# Patient Record
Sex: Male | Born: 1955 | Race: White | Hispanic: No | Marital: Single
Health system: Southern US, Community
[De-identification: ages and names within clinical notes are randomized; demographics above are authoritative.]

## PROBLEM LIST (undated history)

## (undated) DIAGNOSIS — F17200 Nicotine dependence, unspecified, uncomplicated: Secondary | ICD-10-CM

## (undated) DIAGNOSIS — R6521 Severe sepsis with septic shock: Secondary | ICD-10-CM

## (undated) DIAGNOSIS — I482 Chronic atrial fibrillation, unspecified: Secondary | ICD-10-CM

## (undated) DIAGNOSIS — G9341 Metabolic encephalopathy: Secondary | ICD-10-CM

## (undated) DIAGNOSIS — I639 Cerebral infarction, unspecified: Secondary | ICD-10-CM

## (undated) DIAGNOSIS — J690 Pneumonitis due to inhalation of food and vomit: Secondary | ICD-10-CM

## (undated) DIAGNOSIS — J9621 Acute and chronic respiratory failure with hypoxia: Secondary | ICD-10-CM

## (undated) DIAGNOSIS — J449 Chronic obstructive pulmonary disease, unspecified: Secondary | ICD-10-CM

## (undated) HISTORY — PX: TRACHEOSTOMY: SUR1362

---

## 2017-11-15 ENCOUNTER — Inpatient Hospital Stay
Admission: AD | Admit: 2017-11-15 | Discharge: 2017-12-30 | Disposition: A | Discharge status: Skilled Nursing Facility | Payer: Medicaid - Out of State | Source: Other Acute Inpatient Hospital | Attending: Internal Medicine | Admitting: Internal Medicine

## 2017-11-15 ENCOUNTER — Other Ambulatory Visit (HOSPITAL_COMMUNITY): Payer: Medicaid - Out of State

## 2017-11-15 DIAGNOSIS — I482 Chronic atrial fibrillation, unspecified: Secondary | ICD-10-CM | POA: Diagnosis present

## 2017-11-15 DIAGNOSIS — J9690 Respiratory failure, unspecified, unspecified whether with hypoxia or hypercapnia: Secondary | ICD-10-CM

## 2017-11-15 DIAGNOSIS — T85598A Other mechanical complication of other gastrointestinal prosthetic devices, implants and grafts, initial encounter: Secondary | ICD-10-CM

## 2017-11-15 DIAGNOSIS — J449 Chronic obstructive pulmonary disease, unspecified: Secondary | ICD-10-CM | POA: Diagnosis present

## 2017-11-15 DIAGNOSIS — J9621 Acute and chronic respiratory failure with hypoxia: Secondary | ICD-10-CM | POA: Diagnosis present

## 2017-11-15 DIAGNOSIS — Z431 Encounter for attention to gastrostomy: Secondary | ICD-10-CM

## 2017-11-15 DIAGNOSIS — G9341 Metabolic encephalopathy: Secondary | ICD-10-CM | POA: Diagnosis present

## 2017-11-15 DIAGNOSIS — J690 Pneumonitis due to inhalation of food and vomit: Secondary | ICD-10-CM | POA: Diagnosis present

## 2017-11-15 DIAGNOSIS — Z931 Gastrostomy status: Secondary | ICD-10-CM

## 2017-11-15 DIAGNOSIS — R6521 Severe sepsis with septic shock: Secondary | ICD-10-CM | POA: Diagnosis present

## 2017-11-15 DIAGNOSIS — J189 Pneumonia, unspecified organism: Secondary | ICD-10-CM

## 2017-11-15 DIAGNOSIS — Z0189 Encounter for other specified special examinations: Secondary | ICD-10-CM

## 2017-11-15 DIAGNOSIS — F05 Delirium due to known physiological condition: Secondary | ICD-10-CM

## 2017-11-15 HISTORY — DX: Nicotine dependence, unspecified, uncomplicated: F17.200

## 2017-11-15 HISTORY — DX: Cerebral infarction, unspecified: I63.9

## 2017-11-15 HISTORY — DX: Metabolic encephalopathy: G93.41

## 2017-11-15 HISTORY — DX: Severe sepsis with septic shock: R65.21

## 2017-11-15 HISTORY — DX: Acute and chronic respiratory failure with hypoxia: J96.21

## 2017-11-15 HISTORY — DX: Chronic atrial fibrillation, unspecified: I48.20

## 2017-11-15 HISTORY — DX: Pneumonitis due to inhalation of food and vomit: J69.0

## 2017-11-15 HISTORY — DX: Chronic obstructive pulmonary disease, unspecified: J44.9

## 2017-11-15 LAB — BLOOD GAS, ARTERIAL
Acid-Base Excess: 7.8 mmol/L — ABNORMAL HIGH (ref 0.0–2.0)
Bicarbonate: 31.9 mmol/L — ABNORMAL HIGH (ref 20.0–28.0)
FIO2: 0.28
LHR: 15 {breaths}/min
MECHVT: 450 mL
O2 SAT: 98.1 %
PATIENT TEMPERATURE: 98.6
PEEP/CPAP: 5 cmH2O
PO2 ART: 107 mmHg (ref 83.0–108.0)
pCO2 arterial: 45.6 mmHg (ref 32.0–48.0)
pH, Arterial: 7.459 — ABNORMAL HIGH (ref 7.350–7.450)

## 2017-11-15 LAB — C DIFFICILE QUICK SCREEN W PCR REFLEX
C DIFFICILE (CDIFF) TOXIN: NEGATIVE
C DIFFICLE (CDIFF) ANTIGEN: NEGATIVE
C Diff interpretation: NOT DETECTED

## 2017-11-15 MED ORDER — GENERIC EXTERNAL MEDICATION
20.00 | Status: DC
Start: ? — End: 2017-11-15

## 2017-11-15 MED ORDER — GENERIC EXTERNAL MEDICATION
250.00 | Status: DC
Start: 2017-11-16 — End: 2017-11-15

## 2017-11-15 MED ORDER — FENTANYL CITRATE-NACL 2.5-0.9 MG/250ML-% IV SOLN
12.50 | INTRAVENOUS | Status: DC
Start: ? — End: 2017-11-15

## 2017-11-15 MED ORDER — QUETIAPINE FUMARATE 25 MG PO TABS
25.00 | ORAL_TABLET | ORAL | Status: DC
Start: 2017-11-16 — End: 2017-11-15

## 2017-11-15 MED ORDER — OXYCODONE HCL 5 MG PO TABS
5.00 | ORAL_TABLET | ORAL | Status: DC
Start: 2017-11-15 — End: 2017-11-15

## 2017-11-15 MED ORDER — FENTANYL CITRATE (PF) 2500 MCG/50ML IJ SOLN
12.50 | INTRAMUSCULAR | Status: DC
Start: ? — End: 2017-11-15

## 2017-11-15 MED ORDER — CHLORHEXIDINE GLUCONATE 0.12 % MT SOLN
15.00 | OROMUCOSAL | Status: DC
Start: 2017-11-15 — End: 2017-11-15

## 2017-11-15 MED ORDER — METOPROLOL TARTRATE 25 MG PO TABS
12.50 | ORAL_TABLET | ORAL | Status: DC
Start: 2017-11-15 — End: 2017-11-15

## 2017-11-15 MED ORDER — AMIODARONE HCL 200 MG PO TABS
200.00 | ORAL_TABLET | ORAL | Status: DC
Start: 2017-11-16 — End: 2017-11-15

## 2017-11-15 MED ORDER — IPRATROPIUM-ALBUTEROL 0.5-2.5 (3) MG/3ML IN SOLN
3.00 | RESPIRATORY_TRACT | Status: DC
Start: 2017-11-15 — End: 2017-11-15

## 2017-11-15 MED ORDER — METOCLOPRAMIDE HCL 5 MG/ML IJ SOLN
10.00 | INTRAMUSCULAR | Status: DC
Start: 2017-11-15 — End: 2017-11-15

## 2017-11-15 MED ORDER — METOPROLOL TARTRATE 5 MG/5ML IV SOLN
5.00 | INTRAVENOUS | Status: DC
Start: ? — End: 2017-11-15

## 2017-11-15 MED ORDER — POLYETHYLENE GLYCOL 3350 17 G PO PACK
17.00 | PACK | ORAL | Status: DC
Start: ? — End: 2017-11-15

## 2017-11-15 MED ORDER — MIDAZOLAM HCL 5 MG/ML IJ SOLN
2.00 | INTRAMUSCULAR | Status: DC
Start: ? — End: 2017-11-15

## 2017-11-15 MED ORDER — MIDODRINE HCL 5 MG PO TABS
5.00 | ORAL_TABLET | ORAL | Status: DC
Start: 2017-11-15 — End: 2017-11-15

## 2017-11-15 MED ORDER — DOCUSATE SODIUM 150 MG/15ML PO LIQD
100.00 | ORAL | Status: DC
Start: 2017-11-16 — End: 2017-11-15

## 2017-11-15 MED ORDER — BISACODYL 10 MG RE SUPP
10.00 | RECTAL | Status: DC
Start: 2017-11-15 — End: 2017-11-15

## 2017-11-15 MED ORDER — ENOXAPARIN SODIUM 40 MG/0.4ML ~~LOC~~ SOLN
40.00 | SUBCUTANEOUS | Status: DC
Start: 2017-11-15 — End: 2017-11-15

## 2017-11-15 MED ORDER — CLONAZEPAM 0.5 MG PO TABS
2.00 | ORAL_TABLET | ORAL | Status: DC
Start: 2017-11-15 — End: 2017-11-15

## 2017-11-15 MED ORDER — ONDANSETRON HCL 4 MG/2ML IJ SOLN
4.00 | INTRAMUSCULAR | Status: DC
Start: ? — End: 2017-11-15

## 2017-11-15 MED ORDER — IOPAMIDOL (ISOVUE-300) INJECTION 61%
INTRAVENOUS | Status: AC
  Filled 2017-11-15: qty 50

## 2017-11-15 MED ORDER — DEXMEDETOMIDINE HCL IN NACL 400 MCG/100ML IV SOLN
0.10 | INTRAVENOUS | Status: DC
Start: ? — End: 2017-11-15

## 2017-11-15 MED ORDER — PHENOBARBITAL 32.4 MG PO TABS
97.20 | ORAL_TABLET | ORAL | Status: DC
Start: 2017-11-15 — End: 2017-11-15

## 2017-11-15 MED ORDER — MIRTAZAPINE 15 MG PO TABS
15.00 | ORAL_TABLET | ORAL | Status: DC
Start: 2017-11-15 — End: 2017-11-15

## 2017-11-15 MED ORDER — SODIUM CHLORIDE 0.9 % IV SOLN
10.00 | INTRAVENOUS | Status: DC
Start: ? — End: 2017-11-15

## 2017-11-15 MED ORDER — ACETAMINOPHEN 325 MG PO TABS
650.00 | ORAL_TABLET | ORAL | Status: DC
Start: ? — End: 2017-11-15

## 2017-11-15 MED ORDER — QUETIAPINE FUMARATE 25 MG PO TABS
50.00 | ORAL_TABLET | ORAL | Status: DC
Start: 2017-11-15 — End: 2017-11-15

## 2017-11-16 ENCOUNTER — Other Ambulatory Visit (HOSPITAL_COMMUNITY): Payer: Medicaid - Out of State

## 2017-11-16 ENCOUNTER — Institutional Professional Consult (permissible substitution) (HOSPITAL_COMMUNITY): Payer: Medicaid - Out of State

## 2017-11-16 DIAGNOSIS — I482 Chronic atrial fibrillation: Secondary | ICD-10-CM

## 2017-11-16 DIAGNOSIS — J690 Pneumonitis due to inhalation of food and vomit: Secondary | ICD-10-CM | POA: Diagnosis not present

## 2017-11-16 DIAGNOSIS — R6521 Severe sepsis with septic shock: Secondary | ICD-10-CM

## 2017-11-16 DIAGNOSIS — G9341 Metabolic encephalopathy: Secondary | ICD-10-CM

## 2017-11-16 DIAGNOSIS — J449 Chronic obstructive pulmonary disease, unspecified: Secondary | ICD-10-CM

## 2017-11-16 DIAGNOSIS — J9621 Acute and chronic respiratory failure with hypoxia: Secondary | ICD-10-CM

## 2017-11-16 LAB — COMPREHENSIVE METABOLIC PANEL
ALK PHOS: 97 U/L (ref 38–126)
ALT: 31 U/L (ref 0–44)
ANION GAP: 10 (ref 5–15)
AST: 18 U/L (ref 15–41)
Albumin: 2.6 g/dL — ABNORMAL LOW (ref 3.5–5.0)
BILIRUBIN TOTAL: 0.8 mg/dL (ref 0.3–1.2)
BUN: 14 mg/dL (ref 8–23)
CHLORIDE: 99 mmol/L (ref 98–111)
CO2: 30 mmol/L (ref 22–32)
Calcium: 8.8 mg/dL — ABNORMAL LOW (ref 8.9–10.3)
Creatinine, Ser: 0.4 mg/dL — ABNORMAL LOW (ref 0.61–1.24)
GFR calc non Af Amer: >60 mL/min (ref >60)
Glucose, Bld: 111 mg/dL — ABNORMAL HIGH (ref 70–99)
POTASSIUM: 3.4 mmol/L — AB (ref 3.5–5.1)
SODIUM: 139 mmol/L (ref 135–145)
Total Protein: 5.4 g/dL — ABNORMAL LOW (ref 6.5–8.1)

## 2017-11-16 LAB — CBC
HEMATOCRIT: 25.8 % — AB (ref 39.0–52.0)
HEMOGLOBIN: 8.2 g/dL — AB (ref 13.0–17.0)
MCH: 32.9 pg (ref 26.0–34.0)
MCHC: 31.8 g/dL (ref 30.0–36.0)
MCV: 103.6 fL — AB (ref 78.0–100.0)
Platelets: 203 10*3/uL (ref 150–400)
RBC: 2.49 MIL/uL — AB (ref 4.22–5.81)
RDW: 15.6 % — ABNORMAL HIGH (ref 11.5–15.5)
WBC: 3.1 10*3/uL — ABNORMAL LOW (ref 4.0–10.5)

## 2017-11-16 LAB — PROTIME-INR
INR: 1.24
Prothrombin Time: 15.5 seconds — ABNORMAL HIGH (ref 11.4–15.2)

## 2017-11-16 LAB — TSH: TSH: 3.733 u[IU]/mL (ref 0.350–4.500)

## 2017-11-16 NOTE — Consult Note (Signed)
Pulmonary Critical Care Medicine [facility] [place]  PULMONARY SERVICE  Date of Service: 11/16/2017  PULMONARY CRITICAL CARE Daniel Irwin  ZOX:[id number]  DOB: 1955-04-17   DOA: 11/15/2017  Referring Physician: Carron Curie, MD  HPI: Daniel Irwin is a 62 y.o. male seen for follow up of Acute on Chronic Respiratory Failure.  Patient presents for further management of respiratory failure and weaning.  Basically presented to the acute care facility on August 13 with a diagnosis of stroke.  The patient apparently had been in the hospital 1 week prior with an acute exacerbation of COPD and a left basilar pneumonia.  The patient left AGAINST MEDICAL ADVICE.  The patient had been apparently intubated at that previous admission and had been extubated 3 days after admission.  [facility] however patient had a sudden change in his mental status and agitation and delirium and his pupils were found at that time to be unequal.  He was intubated and sent for a CT scan which did not show an acute stroke.  Post intubation x-ray had shown worsening of his lung infiltrate with a complete white out and mucous plugging.  Patient was transferred to a tertiary care center and he was noted to have mucous plugging.  Since the patient was not able to wean patient underwent a tracheostomy by ENT.  The patient has continued unfortunately to have delirium confusion and they were not able to wean him off of the propofol and was actually placed on Precedex.  He was transferred then to our facility for further management.  Review of Systems:  ROS performed and is unremarkable other than noted above.  Past Medical History:  Diagnosis Date  . Acute on chronic respiratory failure with hypoxia (HCC)   . Aspiration pneumonia due to gastric secretions (HCC)   . Chronic atrial fibrillation (HCC)   . COPD, severe (HCC)   . Metabolic encephalopathy   . Severe sepsis with septic  shock (CODE) (HCC)   . Smoker   . Stroke (cerebrum) [facility])     Past Surgical History:  Procedure Laterality Date  . TRACHEOSTOMY      Social History:    has an unknown smoking status. He has never used smokeless tobacco. He reports that he drank alcohol. He reports that he has current or past drug history.  Family History: Non-Contributory to the present illness  Allergies not on file  Medications: Reviewed on Rounds  Physical Exam:  Vitals: Temperature 98.6 pulse 90 respiratory rate 18 blood pressure is 130/60 saturations 99%  Ventilator Settings mode of ventilation pressure support FiO2 28% tidal volume 653 pressure support 12 PEEP 5  . General: Comfortable at this time . Eyes: Grossly normal lids, irises & conjunctiva . ENT: grossly tongue is normal . Neck: no obvious mass . Cardiovascular: S1-S2 normal no gallop or rub . Respiratory: Few scattered distant rhonchi . Abdomen: Soft and nontender . Skin: no rash seen on limited exam . Musculoskeletal: not rigid . Psychiatric:unable to assess . Neurologic: no seizure no involuntary movements         Labs on Admission:  Basic Metabolic Panel: Recent Labs  Lab 11/16/17 0638  NA 139  K 3.4*  CL 99  CO2 30  GLUCOSE 111*  BUN 14  CREATININE 0.40*  CALCIUM 8.8*    Recent Labs  Lab 11/15/17 1534  PHART 7.459*  PCO2ART 45.6  PO2ART 107  HCO3 31.9*  O2SAT 98.1  Liver Function Tests: Recent Labs  Lab 11/16/17 0638  AST 18  ALT 31  ALKPHOS 97  BILITOT 0.8  PROT 5.4*  ALBUMIN 2.6*   No results for input(s): LIPASE, AMYLASE in the last 168 hours. No results for input(s): AMMONIA in the last 168 hours.  CBC: Recent Labs  Lab 11/16/17 0638  WBC 3.1*  HGB 8.2*  HCT 25.8*  MCV 103.6*  PLT 203    Cardiac Enzymes: No results for input(s): CKTOTAL, CKMB, CKMBINDEX, TROPONINI in the last 168 hours.  BNP (last 3 results) No results for input(s): BNP in the last 8760 hours.  ProBNP (last 3  results) No results for input(s): PROBNP in the last 8760 hours.   Radiological Exams on Admission: Dg Chest Port 1 View  Result Date: 11/15/2017 CLINICAL DATA:  62 year old male with respiratory failure, currently ventilated EXAM: PORTABLE CHEST 1 VIEW COMPARISON:  None. FINDINGS: Tracheostomy tube present. The tip is midline and at the level of the clavicles. A right IJ central venous catheter is present. The catheter tip overlies the cavoatrial junction. A nasogastric tube is present, the tip overlies the gastric fundus. Cardiac and mediastinal contours are within normal limits. Atherosclerotic calcifications present in the transverse aorta. Bilateral interstitial prominence and diffuse bronchitic changes. The lungs are also hyperinflated. No pneumothorax, pulmonary edema or large effusion. Nonspecific retrocardiac opacity may reflect atelectasis or infiltrate. IMPRESSION: 1. Satisfactory position of support apparatus. 2. Hyperinflation, diffuse bronchitic and interstitial changes favored to represent sequelae of COPD. 3. Nonspecific left retrocardiac opacity may reflect atelectasis, scarring or infiltrate. 4.  Aortic Atherosclerosis (ICD10-170.0) Electronically Signed   By: Malachy Moan M.D.   On: 11/15/2017 14:11   Dg Abd Portable 1v  Result Date: 11/15/2017 CLINICAL DATA:  NG tube placement EXAM: PORTABLE ABDOMEN - 1 VIEW COMPARISON:  None. FINDINGS: Monitor leads overlie the chest and abdomen. NG tube extends to the proximal stomach beneath the left hemidiaphragm. Central line tip at the [facility] RA junction, partially imaged. Nonspecific bowel gas pattern. IMPRESSION: NG tube proximal stomach. Electronically Signed   By: Judie Petit.  Shick M.D.   On: 11/15/2017 14:08    Assessment/Plan Active Problems:   Acute on chronic respiratory failure with hypoxia (HCC)   Severe sepsis with septic shock (CODE) (HCC)   Chronic atrial fibrillation (HCC)   Metabolic encephalopathy   COPD, severe (HCC)    Aspiration pneumonia due to gastric secretions (HCC)   1. Acute on chronic respiratory failure with hypoxia patient is going to continue with weaning on pressure support.  The patient's goal is for about 4 hours.  So far looks good continue to advance the wean as tolerated continue pulmonary toilet secretion management. 2. Severe sepsis with shock right now is hemodynamically stable we are going to continue with supportive care monitor pressures. 3. Chronic atrial fibrillation rate is controlled continue to monitor rhythm. 4. Metabolic encephalopathy grossly unchanged tolerating some confusion. 5. Severe COPD continue with supportive care. 6. Pneumonia due to aspiration treated with antibiotics.  The last chest film shows chronic bronchitic changes interstitial changes COPD.  Need to also monitor for any mucus plugging's previous history of aspiration he is at increased risk  I have personally seen and evaluated the patient, evaluated laboratory and imaging results, formulated the assessment and plan and placed orders. The Patient requires high complexity decision making for assessment and support.  Case was discussed on Rounds with the Respiratory Therapy Staff Time Spent review of the medical chart and previous hospitalization.  Also discussion with the medical staff on rounds  Yevonne Pax, MD [facility] Pulmonary Critical Care Medicine Sleep Medicine

## 2017-11-17 ENCOUNTER — Encounter: Payer: Self-pay | Admitting: Internal Medicine

## 2017-11-17 DIAGNOSIS — J9621 Acute and chronic respiratory failure with hypoxia: Secondary | ICD-10-CM | POA: Diagnosis present

## 2017-11-17 DIAGNOSIS — J690 Pneumonitis due to inhalation of food and vomit: Secondary | ICD-10-CM | POA: Diagnosis present

## 2017-11-17 DIAGNOSIS — I482 Chronic atrial fibrillation, unspecified: Secondary | ICD-10-CM | POA: Diagnosis present

## 2017-11-17 DIAGNOSIS — J449 Chronic obstructive pulmonary disease, unspecified: Secondary | ICD-10-CM | POA: Diagnosis present

## 2017-11-17 DIAGNOSIS — G9341 Metabolic encephalopathy: Secondary | ICD-10-CM | POA: Diagnosis present

## 2017-11-17 DIAGNOSIS — R6521 Severe sepsis with septic shock: Secondary | ICD-10-CM | POA: Diagnosis present

## 2017-11-17 LAB — POTASSIUM: POTASSIUM: 3.3 mmol/L — AB (ref 3.5–5.1)

## 2017-11-17 NOTE — Progress Notes (Signed)
Pulmonary Critical Care Medicine [facility] [place]   PULMONARY CRITICAL CARE SERVICE  PROGRESS NOTE  Date of Service: 11/17/2017  Daniel Irwin  KCL:[id number]  DOB: 12-May-1955   DOA: 11/15/2017  Referring Physician: Carron Curie, MD  HPI: Daniel Irwin is a 62 y.o. male seen for follow up of Acute on Chronic Respiratory Failure.  Patient is comfortable without distress right now is weaning on pressure support the goal is for about 8 hours today.  Medications: Reviewed on Rounds  Physical Exam:  Vitals: Temperature 97.4 pulse 90 respiratory rate 31 blood pressure 115/56 saturation 95%  Ventilator Settings mode of ventilation pressure support FiO2 28% tidal volume 377 pressure support 12 PEEP 5  . General: Comfortable at this time . Eyes: Grossly normal lids, irises & conjunctiva . ENT: grossly tongue is normal . Neck: no obvious mass . Cardiovascular: S1 S2 normal no gallop . Respiratory: Few scattered distant rhonchi are noted . Abdomen: soft . Skin: no rash seen on limited exam . Musculoskeletal: not rigid . Psychiatric:unable to assess . Neurologic: no seizure no involuntary movements         Lab Data:   Basic Metabolic Panel: Recent Labs  Lab 11/16/17 0638 11/17/17 0525  NA 139  --   K 3.4* 3.3*  CL 99  --   CO2 30  --   GLUCOSE 111*  --   BUN 14  --   CREATININE 0.40*  --   CALCIUM 8.8*  --     ABG: Recent Labs  Lab 11/15/17 1534  PHART 7.459*  PCO2ART 45.6  PO2ART 107  HCO3 31.9*  O2SAT 98.1    Liver Function Tests: Recent Labs  Lab 11/16/17 0638  AST 18  ALT 31  ALKPHOS 97  BILITOT 0.8  PROT 5.4*  ALBUMIN 2.6*   No results for input(s): LIPASE, AMYLASE in the last 168 hours. No results for input(s): AMMONIA in the last 168 hours.  CBC: Recent Labs  Lab 11/16/17 0638  WBC 3.1*  HGB 8.2*  HCT 25.8*  MCV 103.6*  PLT 203    Cardiac Enzymes: No results for input(s): CKTOTAL, CKMB, CKMBINDEX, TROPONINI in the last 168  hours.  BNP (last 3 results) No results for input(s): BNP in the last 8760 hours.  ProBNP (last 3 results) No results for input(s): PROBNP in the last 8760 hours.  Radiological Exams: Dg Abd Portable 1v  Result Date: 11/16/2017 CLINICAL DATA:  NG tube placement EXAM: PORTABLE ABDOMEN - 1 VIEW COMPARISON:  11/16/2017 FINDINGS: Esophageal tube tip overlies the gastric fundus region. Elevation of the left diaphragm. Mild gaseous enlargement of left upper quadrant bowel. IMPRESSION: Esophageal tube tip overlies the proximal stomach Electronically Signed   By: Jasmine Pang M.D.   On: 11/16/2017 23:57   Dg Abd Portable 1v  Result Date: 11/16/2017 CLINICAL DATA:  62 year old male with history of nasogastric tube placement. EXAM: PORTABLE ABDOMEN - 1 VIEW COMPARISON:  Abdominal radiograph 11/16/2017. FINDINGS: Previously noted nasogastric tube is no longer confidently identified. Abdomen is incompletely imaged, but there is a nonobstructive bowel gas pattern. IMPRESSION: 1. Nasogastric tube not visualized, presumably withdrawn or potentially coiled in the oropharynx. Electronically Signed   By: Trudie Reed M.D.   On: 11/16/2017 19:54   Dg Abd Portable 1v  Result Date: 11/16/2017 CLINICAL DATA:  62 year old male. Nasogastric tube placement. Subsequent encounter. EXAM: PORTABLE ABDOMEN - 1 VIEW COMPARISON:  11/15/2017. FINDINGS: Nasogastric tube side hole just beyond the gastroesophageal junction level. Tip gastric  fundus-body junction level. Gas-filled bowel right aspect of the abdomen. IMPRESSION: Nasogastric tube tip gastric fundus-body junction level. Nasogastric tube side hole just beyond the gastroesophageal junction level. Electronically Signed   By: Lacy Duverney M.D.   On: 11/16/2017 15:01    Assessment/Plan Active Problems:   Acute on chronic respiratory failure with hypoxia (HCC)   Severe sepsis with septic shock (CODE) (HCC)   Chronic atrial fibrillation (HCC)   Metabolic  encephalopathy   COPD, severe (HCC)   Aspiration pneumonia due to gastric secretions (HCC)   1. Acute on chronic respiratory failure with hypoxia we will continue to wean on pressure support as mentioned the goal was for 8 hours continue to advance. 2. Severe sepsis with shock right now is hemodynamically stable 3. Chronic atrial fibrillation rate controlled 4. Metabolic encephalopathy grossly unchanged continue with present management. 5. Severe COPD at baseline 6. Aspiration pneumonia treated with antibiotics we will continue to follow   I have personally seen and evaluated the patient, evaluated laboratory and imaging results, formulated the assessment and plan and placed orders. The Patient requires high complexity decision making for assessment and support.  Case was discussed on Rounds with the Respiratory Therapy Staff  Yevonne Pax, MD [facility] Pulmonary Critical Care Medicine Sleep Medicine

## 2017-11-18 DIAGNOSIS — J690 Pneumonitis due to inhalation of food and vomit: Secondary | ICD-10-CM | POA: Diagnosis not present

## 2017-11-18 DIAGNOSIS — I482 Chronic atrial fibrillation: Secondary | ICD-10-CM | POA: Diagnosis not present

## 2017-11-18 DIAGNOSIS — J449 Chronic obstructive pulmonary disease, unspecified: Secondary | ICD-10-CM | POA: Diagnosis not present

## 2017-11-18 DIAGNOSIS — J9621 Acute and chronic respiratory failure with hypoxia: Secondary | ICD-10-CM | POA: Diagnosis not present

## 2017-11-18 NOTE — Progress Notes (Signed)
Pulmonary Critical Care Medicine [facility] [place]   PULMONARY CRITICAL CARE SERVICE  PROGRESS NOTE  Date of Service: 11/18/2017  Daniel Irwin  URK:[id number]  DOB: May 17, 1955   DOA: 11/15/2017  Referring Physician: Carron Curie, MD  HPI: Daniel Irwin is a 62 y.o. male seen for follow up of Acute on Chronic Respiratory Failure.  Patient right now is comfortable without distress has been on a pressure support and 920% FiO2.  Has been little bit more agitated notably  Medications: Reviewed on Rounds  Physical Exam:  Vitals: Temperature 96.7 pulse 94 respiratory rate 27 blood pressure 97/81 saturations 96%  Ventilator Settings currently on pressure support FiO2 28% tidal volume 300 pressure support 12 PEEP 5  . General: Comfortable at this time . Eyes: Grossly normal lids, irises & conjunctiva . ENT: grossly tongue is normal . Neck: no obvious mass . Cardiovascular: S1 S2 normal no gallop . Respiratory: No rhonchi or rales are noted . Abdomen: soft . Skin: no rash seen on limited exam . Musculoskeletal: not rigid . Psychiatric:unable to assess . Neurologic: no seizure no involuntary movements         Lab Data:   Basic Metabolic Panel: Recent Labs  Lab 11/16/17 0638 11/17/17 0525  NA 139  --   K 3.4* 3.3*  CL 99  --   CO2 30  --   GLUCOSE 111*  --   BUN 14  --   CREATININE 0.40*  --   CALCIUM 8.8*  --     ABG: Recent Labs  Lab 11/15/17 1534  PHART 7.459*  PCO2ART 45.6  PO2ART 107  HCO3 31.9*  O2SAT 98.1    Liver Function Tests: Recent Labs  Lab 11/16/17 0638  AST 18  ALT 31  ALKPHOS 97  BILITOT 0.8  PROT 5.4*  ALBUMIN 2.6*   No results for input(s): LIPASE, AMYLASE in the last 168 hours. No results for input(s): AMMONIA in the last 168 hours.  CBC: Recent Labs  Lab 11/16/17 0638  WBC 3.1*  HGB 8.2*  HCT 25.8*  MCV 103.6*  PLT 203    Cardiac Enzymes: No results for input(s): CKTOTAL, CKMB, CKMBINDEX, TROPONINI in the last  168 hours.  BNP (last 3 results) No results for input(s): BNP in the last 8760 hours.  ProBNP (last 3 results) No results for input(s): PROBNP in the last 8760 hours.  Radiological Exams: Dg Abd Portable 1v  Result Date: 11/16/2017 CLINICAL DATA:  NG tube placement EXAM: PORTABLE ABDOMEN - 1 VIEW COMPARISON:  11/16/2017 FINDINGS: Esophageal tube tip overlies the gastric fundus region. Elevation of the left diaphragm. Mild gaseous enlargement of left upper quadrant bowel. IMPRESSION: Esophageal tube tip overlies the proximal stomach Electronically Signed   By: Jasmine Pang M.D.   On: 11/16/2017 23:57   Dg Abd Portable 1v  Result Date: 11/16/2017 CLINICAL DATA:  62 year old male with history of nasogastric tube placement. EXAM: PORTABLE ABDOMEN - 1 VIEW COMPARISON:  Abdominal radiograph 11/16/2017. FINDINGS: Previously noted nasogastric tube is no longer confidently identified. Abdomen is incompletely imaged, but there is a nonobstructive bowel gas pattern. IMPRESSION: 1. Nasogastric tube not visualized, presumably withdrawn or potentially coiled in the oropharynx. Electronically Signed   By: Trudie Reed M.D.   On: 11/16/2017 19:54   Dg Abd Portable 1v  Result Date: 11/16/2017 CLINICAL DATA:  62 year old male. Nasogastric tube placement. Subsequent encounter. EXAM: PORTABLE ABDOMEN - 1 VIEW COMPARISON:  11/15/2017. FINDINGS: Nasogastric tube side hole just beyond the gastroesophageal junction  level. Tip gastric fundus-body junction level. Gas-filled bowel right aspect of the abdomen. IMPRESSION: Nasogastric tube tip gastric fundus-body junction level. Nasogastric tube side hole just beyond the gastroesophageal junction level. Electronically Signed   By: Lacy Duverney M.D.   On: 11/16/2017 15:01    Assessment/Plan Active Problems:   Acute on chronic respiratory failure with hypoxia (HCC)   Severe sepsis with septic shock (CODE) (HCC)   Chronic atrial fibrillation (HCC)   Metabolic  encephalopathy   COPD, severe (HCC)   Aspiration pneumonia due to gastric secretions (HCC)   1. Acute on chronic respiratory failure with hypoxia we will continue with the pressure support weaning attempt.  Continue pulmonary toilet secretion management. 2. Severe sepsis with shock hemodynamically stable. 3. Chronic atrial fibrillation rate is controlled continue supportive care 4. Metabolic encephalopathy still periodically confused 5. Severe COPD continue with present management. 6. Pneumonia due to aspiration follow x-ray   I have personally seen and evaluated the patient, evaluated laboratory and imaging results, formulated the assessment and plan and placed orders. The Patient requires high complexity decision making for assessment and support.  Case was discussed on Rounds with the Respiratory Therapy Staff  Yevonne Pax, MD [facility] Pulmonary Critical Care Medicine Sleep Medicine

## 2017-11-19 DIAGNOSIS — J449 Chronic obstructive pulmonary disease, unspecified: Secondary | ICD-10-CM | POA: Diagnosis not present

## 2017-11-19 DIAGNOSIS — I482 Chronic atrial fibrillation: Secondary | ICD-10-CM | POA: Diagnosis not present

## 2017-11-19 DIAGNOSIS — J690 Pneumonitis due to inhalation of food and vomit: Secondary | ICD-10-CM | POA: Diagnosis not present

## 2017-11-19 DIAGNOSIS — J9621 Acute and chronic respiratory failure with hypoxia: Secondary | ICD-10-CM | POA: Diagnosis not present

## 2017-11-19 NOTE — Progress Notes (Signed)
Pulmonary Critical Care Medicine [facility] [place]   PULMONARY CRITICAL CARE SERVICE  PROGRESS NOTE  Date of Service: 11/19/2017  [name]  QVZ:[id number]  DOB: 02-Jul-1955   DOA: 11/15/2017  Referring Physician: Carron Curie, MD  HPI: Daniel Irwin is a 62 y.o. male seen for follow up of Acute on Chronic Respiratory Failure.  Patient is on full support on assist control mode, has been agitated and has copious amounts of secretions  Medications: Reviewed on Rounds  Physical Exam:  Vitals: Temperature 97.6 pulse 96 respiratory rate 25 blood pressure 133/70 saturation 96%  Ventilator Settings mode of ventilation assist control FiO2 20% tidal volume 458 PEEP 5  . General: Comfortable at this time . Eyes: Grossly normal lids, irises & conjunctiva . ENT: grossly tongue is normal . Neck: no obvious mass . Cardiovascular: S1 S2 normal no gallop . Respiratory: Coarse rhonchi are noted bilaterally . Abdomen: soft . Skin: no rash seen on limited exam . Musculoskeletal: not rigid . Psychiatric:unable to assess . Neurologic: no seizure no involuntary movements         Lab Data:   Basic Metabolic Panel: Recent Labs  Lab 11/16/17 0638 11/17/17 0525  NA 139  --   K 3.4* 3.3*  CL 99  --   CO2 30  --   GLUCOSE 111*  --   BUN 14  --   CREATININE 0.40*  --   CALCIUM 8.8*  --     ABG: Recent Labs  Lab 11/15/17 1534  PHART 7.459*  PCO2ART 45.6  PO2ART 107  HCO3 31.9*  O2SAT 98.1    Liver Function Tests: Recent Labs  Lab 11/16/17 0638  AST 18  ALT 31  ALKPHOS 97  BILITOT 0.8  PROT 5.4*  ALBUMIN 2.6*   No results for input(s): LIPASE, AMYLASE in the last 168 hours. No results for input(s): AMMONIA in the last 168 hours.  CBC: Recent Labs  Lab 11/16/17 0638  WBC 3.1*  HGB 8.2*  HCT 25.8*  MCV 103.6*  PLT 203    Cardiac Enzymes: No results for input(s): CKTOTAL, CKMB, CKMBINDEX, TROPONINI in the last 168 hours.  BNP (last 3 results) No  results for input(s): BNP in the last 8760 hours.  ProBNP (last 3 results) No results for input(s): PROBNP in the last 8760 hours.  Radiological Exams: No results found.  Assessment/Plan Active Problems:   Acute on chronic respiratory failure with hypoxia (HCC)   Severe sepsis with septic shock (CODE) (HCC)   Chronic atrial fibrillation (HCC)   Metabolic encephalopathy   COPD, severe (HCC)   Aspiration pneumonia due to gastric secretions (HCC)   1. Acute on chronic respiratory failure with hypoxia we will continue with secretions are copious not able to wean. 2. Severe sepsis with shock hemodynamically stable continue present management. 3. Chronic atrial fibrillation rate is controlled 4. Metabolic encephalopathy still somewhat agitated. 5. Severe COPD at baseline 6. Pneumonia due to aspiration treated   I have personally seen and evaluated the patient, evaluated laboratory and imaging results, formulated the assessment and plan and placed orders. The Patient requires high complexity decision making for assessment and support.  Case was discussed on Rounds with the Respiratory Therapy Staff  Yevonne Pax, MD [facility] Pulmonary Critical Care Medicine Sleep Medicine

## 2017-11-20 DIAGNOSIS — J449 Chronic obstructive pulmonary disease, unspecified: Secondary | ICD-10-CM | POA: Diagnosis not present

## 2017-11-20 DIAGNOSIS — J9621 Acute and chronic respiratory failure with hypoxia: Secondary | ICD-10-CM | POA: Diagnosis not present

## 2017-11-20 DIAGNOSIS — I482 Chronic atrial fibrillation: Secondary | ICD-10-CM | POA: Diagnosis not present

## 2017-11-20 DIAGNOSIS — J690 Pneumonitis due to inhalation of food and vomit: Secondary | ICD-10-CM | POA: Diagnosis not present

## 2017-11-20 NOTE — Progress Notes (Signed)
Pulmonary Critical Care Medicine [facility] [place]   PULMONARY CRITICAL CARE SERVICE  PROGRESS NOTE  Date of Service: 11/20/2017  Daniel Irwin  FAO:[id number]  DOB: [birth year]   DOA: 11/15/2017  Referring Physician: Carron Curie, MD  HPI: Daniel Irwin is a 62 y.o. male seen for follow up of Acute on Chronic Respiratory Failure.  Patient is comfortable without distress was put back on the assist control mode right now patient is resting  Medications: Reviewed on Rounds  Physical Exam:  Vitals: Temperature 98.9 pulse 81 respiratory rate 30 blood pressure 135/76 saturation 99%  Ventilator Settings mode of ventilation assist control FiO2 20% tidal volume 433 PEEP 5  . General: Comfortable at this time . Eyes: Grossly normal lids, irises & conjunctiva . ENT: grossly tongue is normal . Neck: no obvious mass . Cardiovascular: S1 S2 normal no gallop . Respiratory: No rhonchi no rales . Abdomen: soft . Skin: no rash seen on limited exam . Musculoskeletal: not rigid . Psychiatric:unable to assess . Neurologic: no seizure no involuntary movements         Lab Data:   Basic Metabolic Panel: Recent Labs  Lab 11/16/17 0638 11/17/17 0525  NA 139  --   K 3.4* 3.3*  CL 99  --   CO2 30  --   GLUCOSE 111*  --   BUN 14  --   CREATININE 0.40*  --   CALCIUM 8.8*  --     ABG: Recent Labs  Lab 11/15/17 1534  PHART 7.459*  PCO2ART 45.6  PO2ART 107  HCO3 31.9*  O2SAT 98.1    Liver Function Tests: Recent Labs  Lab 11/16/17 0638  AST 18  ALT 31  ALKPHOS 97  BILITOT 0.8  PROT 5.4*  ALBUMIN 2.6*   No results for input(s): LIPASE, AMYLASE in the last 168 hours. No results for input(s): AMMONIA in the last 168 hours.  CBC: Recent Labs  Lab 11/16/17 0638  WBC 3.1*  HGB 8.2*  HCT 25.8*  MCV 103.6*  PLT 203    Cardiac Enzymes: No results for input(s): CKTOTAL, CKMB, CKMBINDEX, TROPONINI in the last 168 hours.  BNP (last 3 results) No results for  input(s): BNP in the last 8760 hours.  ProBNP (last 3 results) No results for input(s): PROBNP in the last 8760 hours.  Radiological Exams: No results found.  Assessment/Plan Active Problems:   Acute on chronic respiratory failure with hypoxia (HCC)   Severe sepsis with septic shock (CODE) (HCC)   Chronic atrial fibrillation (HCC)   Metabolic encephalopathy   COPD, severe (HCC)   Aspiration pneumonia due to gastric secretions (HCC)   1. Acute on chronic respiratory failure with hypoxia we will continue to try to advance to wean with checking the spontaneous breathing indices. 2. Severe sepsis hemodynamically stable 3. Chronic atrial fibrillation rate is controlled 4. Encephalopathy unchanged 5. Severe COPD we will continue present management. 6. Pneumonia due to aspiration treated we will monitor   I have personally seen and evaluated the patient, evaluated laboratory and imaging results, formulated the assessment and plan and placed orders. The Patient requires high complexity decision making for assessment and support.  Case was discussed on Rounds with the Respiratory Therapy Staff  Yevonne Pax, MD [facility] Pulmonary Critical Care Medicine Sleep Medicine

## 2017-11-21 DIAGNOSIS — J9621 Acute and chronic respiratory failure with hypoxia: Secondary | ICD-10-CM | POA: Diagnosis not present

## 2017-11-21 DIAGNOSIS — I482 Chronic atrial fibrillation: Secondary | ICD-10-CM | POA: Diagnosis not present

## 2017-11-21 DIAGNOSIS — J449 Chronic obstructive pulmonary disease, unspecified: Secondary | ICD-10-CM | POA: Diagnosis not present

## 2017-11-21 DIAGNOSIS — J690 Pneumonitis due to inhalation of food and vomit: Secondary | ICD-10-CM | POA: Diagnosis not present

## 2017-11-21 NOTE — Progress Notes (Signed)
Pulmonary Critical Care Medicine [facility] [place]   PULMONARY CRITICAL CARE SERVICE  PROGRESS NOTE  Date of Service: 11/21/2017  Nir Delage  AYO:[id number]  DOB: 1956-01-28   DOA: 11/15/2017  Referring Physician: Carron Curie, MD  HPI: Hilmer Litwak is a 62 y.o. male seen for follow up of Acute on Chronic Respiratory Failure.  Patient remains on the ventilator not weaning today on full support right now.  Assist control mode  Medications: Reviewed on Rounds  Physical Exam:  Vitals: Temperature 98.5 pulse 86 respiratory rate 23 blood pressure 101/60 saturations 98%  Ventilator Settings currently is on assist control FiO2 28% tidal volume 660 PEEP 5  . General: Comfortable at this time . Eyes: Grossly normal lids, irises & conjunctiva . ENT: grossly tongue is normal . Neck: no obvious mass . Cardiovascular: S1 S2 normal no gallop . Respiratory: Scattered rhonchi are noted . Abdomen: soft . Skin: no rash seen on limited exam . Musculoskeletal: not rigid . Psychiatric:unable to assess . Neurologic: no seizure no involuntary movements         Lab Data:   Basic Metabolic Panel: Recent Labs  Lab 11/16/17 0638 11/17/17 0525  NA 139  --   K 3.4* 3.3*  CL 99  --   CO2 30  --   GLUCOSE 111*  --   BUN 14  --   CREATININE 0.40*  --   CALCIUM 8.8*  --     ABG: Recent Labs  Lab 11/15/17 1534  PHART 7.459*  PCO2ART 45.6  PO2ART 107  HCO3 31.9*  O2SAT 98.1    Liver Function Tests: Recent Labs  Lab 11/16/17 0638  AST 18  ALT 31  ALKPHOS 97  BILITOT 0.8  PROT 5.4*  ALBUMIN 2.6*   No results for input(s): LIPASE, AMYLASE in the last 168 hours. No results for input(s): AMMONIA in the last 168 hours.  CBC: Recent Labs  Lab 11/16/17 0638  WBC 3.1*  HGB 8.2*  HCT 25.8*  MCV 103.6*  PLT 203    Cardiac Enzymes: No results for input(s): CKTOTAL, CKMB, CKMBINDEX, TROPONINI in the last 168 hours.  BNP (last 3 results) No results for input(s):  BNP in the last 8760 hours.  ProBNP (last 3 results) No results for input(s): PROBNP in the last 8760 hours.  Radiological Exams: No results found.  Assessment/Plan Active Problems:   Acute on chronic respiratory failure with hypoxia (HCC)   Severe sepsis with septic shock (CODE) (HCC)   Chronic atrial fibrillation (HCC)   Metabolic encephalopathy   COPD, severe (HCC)   Aspiration pneumonia due to gastric secretions (HCC)   1. Acute on chronic respiratory failure with hypoxia we will continue to wean on assist control continue pulmonary toilet secretion management. 2. Severe sepsis with shock hemodynamically stable will follow 3. Chronic atrial fibrillation rate is controlled 4. Metabolic encephalopathy grossly unchanged 5. COPD severe disease continue present management 6. Pneumonia due to aspiration treated we will continue to follow x-ray as necessary   I have personally seen and evaluated the patient, evaluated laboratory and imaging results, formulated the assessment and plan and placed orders. The Patient requires high complexity decision making for assessment and support.  Case was discussed on Rounds with the Respiratory Therapy Staff  Yevonne Pax, MD [facility] Pulmonary Critical Care Medicine Sleep Medicine

## 2017-11-22 ENCOUNTER — Other Ambulatory Visit (HOSPITAL_COMMUNITY): Payer: Medicaid - Out of State

## 2017-11-22 DIAGNOSIS — J9621 Acute and chronic respiratory failure with hypoxia: Secondary | ICD-10-CM | POA: Diagnosis not present

## 2017-11-22 DIAGNOSIS — J449 Chronic obstructive pulmonary disease, unspecified: Secondary | ICD-10-CM | POA: Diagnosis not present

## 2017-11-22 DIAGNOSIS — J690 Pneumonitis due to inhalation of food and vomit: Secondary | ICD-10-CM | POA: Diagnosis not present

## 2017-11-22 DIAGNOSIS — I482 Chronic atrial fibrillation: Secondary | ICD-10-CM | POA: Diagnosis not present

## 2017-11-22 LAB — CBC
HEMATOCRIT: 30.3 % — AB (ref 39.0–52.0)
Hemoglobin: 9.4 g/dL — ABNORMAL LOW (ref 13.0–17.0)
MCH: 32.2 pg (ref 26.0–34.0)
MCHC: 31 g/dL (ref 30.0–36.0)
MCV: 103.8 fL — AB (ref 78.0–100.0)
Platelets: 425 10*3/uL — ABNORMAL HIGH (ref 150–400)
RBC: 2.92 MIL/uL — AB (ref 4.22–5.81)
RDW: 14.2 % (ref 11.5–15.5)
WBC: 8.4 10*3/uL (ref 4.0–10.5)

## 2017-11-22 LAB — BASIC METABOLIC PANEL
ANION GAP: 9 (ref 5–15)
BUN: 14 mg/dL (ref 8–23)
CO2: 33 mmol/L — AB (ref 22–32)
Calcium: 9.1 mg/dL (ref 8.9–10.3)
Chloride: 98 mmol/L (ref 98–111)
Creatinine, Ser: 0.4 mg/dL — ABNORMAL LOW (ref 0.61–1.24)
GFR calc Af Amer: >60 mL/min (ref >60)
GLUCOSE: 96 mg/dL (ref 70–99)
POTASSIUM: 3.6 mmol/L (ref 3.5–5.1)
Sodium: 140 mmol/L (ref 135–145)

## 2017-11-22 NOTE — Progress Notes (Signed)
Pulmonary Critical Care Medicine [facility] [place]   PULMONARY CRITICAL CARE SERVICE  PROGRESS NOTE  Date of Service: 11/22/2017  Daniel Irwin  WUJ:[id number]  DOB: Oct 24, 1955   DOA: 11/15/2017  Referring Physician: Carron Curie, MD  HPI: Daniel Irwin is a 62 y.o. male seen for follow up of Acute on Chronic Respiratory Failure.  Patient is on full vent support has been increasingly agitated and not able to tolerate any weaning.  Right now is on 28% FiO2  Medications: Reviewed on Rounds  Physical Exam:  Vitals: Temperature 98.7 pulse 106 respiratory rate 31 blood pressure 9451 saturating 96%  Ventilator Settings mode of ventilation is assist control FiO2 28% tidal volume 412 PEEP 5  . General: Comfortable at this time . Eyes: Grossly normal lids, irises & conjunctiva . ENT: grossly tongue is normal . Neck: no obvious mass . Cardiovascular: S1 S2 normal no gallop . Respiratory: No rhonchi no rales . Abdomen: soft . Skin: no rash seen on limited exam . Musculoskeletal: not rigid . Psychiatric:unable to assess . Neurologic: no seizure no involuntary movements         Lab Data:   Basic Metabolic Panel: Recent Labs  Lab 11/16/17 0638 11/17/17 0525 11/22/17 0726  NA 139  --  140  K 3.4* 3.3* 3.6  CL 99  --  98  CO2 30  --  33*  GLUCOSE 111*  --  96  BUN 14  --  14  CREATININE 0.40*  --  0.40*  CALCIUM 8.8*  --  9.1    ABG: No results for input(s): PHART, PCO2ART, PO2ART, HCO3, O2SAT in the last 168 hours.  Liver Function Tests: Recent Labs  Lab 11/16/17 0638  AST 18  ALT 31  ALKPHOS 97  BILITOT 0.8  PROT 5.4*  ALBUMIN 2.6*   No results for input(s): LIPASE, AMYLASE in the last 168 hours. No results for input(s): AMMONIA in the last 168 hours.  CBC: Recent Labs  Lab 11/16/17 0638 11/22/17 0726  WBC 3.1* 8.4  HGB 8.2* 9.4*  HCT 25.8* 30.3*  MCV 103.6* 103.8*  PLT 203 425*    Cardiac Enzymes: No results for input(s): CKTOTAL, CKMB,  CKMBINDEX, TROPONINI in the last 168 hours.  BNP (last 3 results) No results for input(s): BNP in the last 8760 hours.  ProBNP (last 3 results) No results for input(s): PROBNP in the last 8760 hours.  Radiological Exams: Dg Chest Port 1 View  Result Date: 11/22/2017 CLINICAL DATA:  Respiratory failure. EXAM: PORTABLE CHEST 1 VIEW COMPARISON:  Radiographs of November 15, 2017. FINDINGS: The heart size and mediastinal contours are within normal limits. No pneumothorax or pleural effusion is noted. Tracheostomy tube and nasogastric tube are unchanged in position. Right internal jugular catheter noted on prior exam has been removed. Both lungs are clear. The visualized skeletal structures are unremarkable. IMPRESSION: Stable position of tracheostomy and nasogastric tubes. No acute cardiopulmonary abnormality seen. Electronically Signed   By: Lupita Raider, M.D.   On: 11/22/2017 08:28    Assessment/Plan Active Problems:   Acute on chronic respiratory failure with hypoxia (HCC)   Severe sepsis with septic shock (CODE) (HCC)   Chronic atrial fibrillation (HCC)   Metabolic encephalopathy   COPD, severe (HCC)   Aspiration pneumonia due to gastric secretions (HCC)   1. Acute on chronic respiratory failure with hypoxia right now is not able to wean because of increased agitation we will continue with assist control while his medications are being  adjusted. 2. Severe sepsis treated hemodynamically stable 3. Chronic atrial fibrillation rate controlled 4. Metabolic encephalopathy grossly unchanged 5. Severe COPD continue present management 6. Aspiration pneumonia treated we will monitor current chest x-ray shows no acute disease   I have personally seen and evaluated the patient, evaluated laboratory and imaging results, formulated the assessment and plan and placed orders. The Patient requires high complexity decision making for assessment and support.  Case was discussed on Rounds with the  Respiratory Therapy Staff  Yevonne Pax, MD [facility] Pulmonary Critical Care Medicine Sleep Medicine

## 2017-11-23 DIAGNOSIS — J690 Pneumonitis due to inhalation of food and vomit: Secondary | ICD-10-CM | POA: Diagnosis not present

## 2017-11-23 DIAGNOSIS — J449 Chronic obstructive pulmonary disease, unspecified: Secondary | ICD-10-CM | POA: Diagnosis not present

## 2017-11-23 DIAGNOSIS — J9621 Acute and chronic respiratory failure with hypoxia: Secondary | ICD-10-CM | POA: Diagnosis not present

## 2017-11-23 DIAGNOSIS — I482 Chronic atrial fibrillation: Secondary | ICD-10-CM | POA: Diagnosis not present

## 2017-11-23 NOTE — Progress Notes (Signed)
Pulmonary Critical Care Medicine [facility] [place]   PULMONARY CRITICAL CARE SERVICE  PROGRESS NOTE  Date of Service: 11/23/2017  Daniel Irwin  [id number]  DOB: 29-Jan-1956   DOA: 11/15/2017  Referring Physician: Carron Curie, MD  HPI: Daniel Irwin is a 63 y.o. male seen for follow up of Acute on Chronic Respiratory Failure.  Patient is on full vent support.  Was attempted on weaning did not tolerate.  Patient right now is on assist control mode  Medications: Reviewed on Rounds  Physical Exam:  Vitals: Temperature 98.6 pulse 95 respiratory rate 32 blood pressure 151/71 saturations 97%  Ventilator Settings mode of ventilation assist control FiO2 28% tidal volume 46 PEEP 5  . General: Comfortable at this time . Eyes: Grossly normal lids, irises & conjunctiva . ENT: grossly tongue is normal . Neck: no obvious mass . Cardiovascular: S1 S2 normal no gallop . Respiratory: Few rhonchi are noted . Abdomen: soft . Skin: no rash seen on limited exam . Musculoskeletal: not rigid . Psychiatric:unable to assess . Neurologic: no seizure no involuntary movements         Lab Data:   Basic Metabolic Panel: Recent Labs  Lab 11/17/17 0525 11/22/17 0726  NA  --  140  K 3.3* 3.6  CL  --  98  CO2  --  33*  GLUCOSE  --  96  BUN  --  14  CREATININE  --  0.40*  CALCIUM  --  9.1    ABG: No results for input(s): PHART, PCO2ART, PO2ART, HCO3, O2SAT in the last 168 hours.  Liver Function Tests: No results for input(s): AST, ALT, ALKPHOS, BILITOT, PROT, ALBUMIN in the last 168 hours. No results for input(s): LIPASE, AMYLASE in the last 168 hours. No results for input(s): AMMONIA in the last 168 hours.  CBC: Recent Labs  Lab 11/22/17 0726  WBC 8.4  HGB 9.4*  HCT 30.3*  MCV 103.8*  PLT 425*    Cardiac Enzymes: No results for input(s): CKTOTAL, CKMB, CKMBINDEX, TROPONINI in the last 168 hours.  BNP (last 3 results) No results for input(s): BNP in the last 8760  hours.  ProBNP (last 3 results) No results for input(s): PROBNP in the last 8760 hours.  Radiological Exams: Dg Chest Port 1 View  Result Date: 11/22/2017 CLINICAL DATA:  Respiratory failure. EXAM: PORTABLE CHEST 1 VIEW COMPARISON:  Radiographs of November 15, 2017. FINDINGS: The heart size and mediastinal contours are within normal limits. No pneumothorax or pleural effusion is noted. Tracheostomy tube and nasogastric tube are unchanged in position. Right internal jugular catheter noted on prior exam has been removed. Both lungs are clear. The visualized skeletal structures are unremarkable. IMPRESSION: Stable position of tracheostomy and nasogastric tubes. No acute cardiopulmonary abnormality seen. Electronically Signed   By: Lupita Raider, M.D.   On: 11/22/2017 08:28    Assessment/Plan Active Problems:   Acute on chronic respiratory failure with hypoxia (HCC)   Severe sepsis with septic shock (CODE) (HCC)   Chronic atrial fibrillation (HCC)   Metabolic encephalopathy   COPD, severe (HCC)   Aspiration pneumonia due to gastric secretions (HCC)   1. Acute on chronic respiratory failure with hypoxia we will continue with full support on assist control mode titrate oxygen as tolerated.  Continue to assess the spontaneous breathing index 2. Severe sepsis which shock hemodynamically stable 3. Chronic atrial fibrillation rate is controlled 4. Metabolic encephalopathy grossly unchanged. 5. Aspiration pneumonia treated continue with supportive care last chest x-ray  was clear   I have personally seen and evaluated the patient, evaluated laboratory and imaging results, formulated the assessment and plan and placed orders. The Patient requires high complexity decision making for assessment and support.  Case was discussed on Rounds with the Respiratory Therapy Staff  Yevonne Pax, MD [facility] Pulmonary Critical Care Medicine Sleep Medicine

## 2017-11-24 DIAGNOSIS — J690 Pneumonitis due to inhalation of food and vomit: Secondary | ICD-10-CM | POA: Diagnosis not present

## 2017-11-24 DIAGNOSIS — J9621 Acute and chronic respiratory failure with hypoxia: Secondary | ICD-10-CM | POA: Diagnosis not present

## 2017-11-24 DIAGNOSIS — I482 Chronic atrial fibrillation: Secondary | ICD-10-CM | POA: Diagnosis not present

## 2017-11-24 DIAGNOSIS — J449 Chronic obstructive pulmonary disease, unspecified: Secondary | ICD-10-CM | POA: Diagnosis not present

## 2017-11-24 NOTE — Progress Notes (Signed)
Pulmonary Critical Care Medicine [facility] [place]   PULMONARY CRITICAL CARE SERVICE  PROGRESS NOTE  Date of Service: 11/24/2017  Daniel Irwin  [id number]  DOB: 1956/01/27   DOA: 11/15/2017  Referring Physician: Carron Curie, MD  HPI: Daniel Irwin is a 62 y.o. male seen for follow up of Acute on Chronic Respiratory Failure.  Patient right now is on assist control mode comfortable without distress.  Has been on 35% oxygen.  Patient is currently on a PEEP of 5 good volumes  Medications: Reviewed on Rounds  Physical Exam:  Vitals: Temperature 97.3 pulse 83 respiratory rate 28 blood pressure 140/72 saturations are 96%.  Ventilator Settings mode of ventilation is assist control FiO2 35% tidal volume 450 PEEP 5  . General: Comfortable at this time . Eyes: Grossly normal lids, irises & conjunctiva . ENT: grossly tongue is normal . Neck: no obvious mass . Cardiovascular: S1 S2 normal no gallop . Respiratory: No rhonchi or rales are noted . Abdomen: soft . Skin: no rash seen on limited exam . Musculoskeletal: not rigid . Psychiatric:unable to assess . Neurologic: no seizure no involuntary movements         Lab Data:   Basic Metabolic Panel: Recent Labs  Lab 11/22/17 0726  NA 140  K 3.6  CL 98  CO2 33*  GLUCOSE 96  BUN 14  CREATININE 0.40*  CALCIUM 9.1    ABG: No results for input(s): PHART, PCO2ART, PO2ART, HCO3, O2SAT in the last 168 hours.  Liver Function Tests: No results for input(s): AST, ALT, ALKPHOS, BILITOT, PROT, ALBUMIN in the last 168 hours. No results for input(s): LIPASE, AMYLASE in the last 168 hours. No results for input(s): AMMONIA in the last 168 hours.  CBC: Recent Labs  Lab 11/22/17 0726  WBC 8.4  HGB 9.4*  HCT 30.3*  MCV 103.8*  PLT 425*    Cardiac Enzymes: No results for input(s): CKTOTAL, CKMB, CKMBINDEX, TROPONINI in the last 168 hours.  BNP (last 3 results) No results for input(s): BNP in the last 8760  hours.  ProBNP (last 3 results) No results for input(s): PROBNP in the last 8760 hours.  Radiological Exams: No results found.  Assessment/Plan Active Problems:   Acute on chronic respiratory failure with hypoxia (HCC)   Severe sepsis with septic shock (CODE) (HCC)   Chronic atrial fibrillation (HCC)   Metabolic encephalopathy   COPD, severe (HCC)   Aspiration pneumonia due to gastric secretions (HCC)   1. Acute on chronic respiratory failure with hypoxia we will continue with the full vent support at this time and assist control mode.  Patient is still significantly having periods of increased anxiety agitation is not able to tolerate the weaning. 2. Severe sepsis with shock resolved right now is hemodynamically stable 3. Chronic atrial fibrillation rate is controlled we will continue present therapy 4. Metabolic encephalopathy at baseline continue to follow 5. Severe COPD stable we will continue present therapy 6. Pneumonia due to aspiration treated   I have personally seen and evaluated the patient, evaluated laboratory and imaging results, formulated the assessment and plan and placed orders. The Patient requires high complexity decision making for assessment and support.  Case was discussed on Rounds with the Respiratory Therapy Staff  Yevonne Pax, MD [facility] Pulmonary Critical Care Medicine Sleep Medicine

## 2017-11-25 DIAGNOSIS — J690 Pneumonitis due to inhalation of food and vomit: Secondary | ICD-10-CM | POA: Diagnosis not present

## 2017-11-25 DIAGNOSIS — J449 Chronic obstructive pulmonary disease, unspecified: Secondary | ICD-10-CM | POA: Diagnosis not present

## 2017-11-25 DIAGNOSIS — I482 Chronic atrial fibrillation: Secondary | ICD-10-CM | POA: Diagnosis not present

## 2017-11-25 DIAGNOSIS — J9621 Acute and chronic respiratory failure with hypoxia: Secondary | ICD-10-CM | POA: Diagnosis not present

## 2017-11-25 NOTE — Progress Notes (Signed)
[facility] [place]   PULMONARY CRITICAL CARE SERVICE  PROGRESS NOTE  Date of Service: 11/25/2017  Daniel Irwin  [id number]  DOB: [birth year]   DOA: 11/15/2017  Referring Physician: Carron CurieAli Hijazi, MD  HPI: Daniel Irwin is a 62 y.o. male seen for follow up of Acute on Chronic Respiratory Failure.  Patient is comfortable at this time is been on pressure support mode.  Gradually trying to wean.  Right now is on 35% FiO2  Medications: Reviewed on Rounds  Physical Exam:  Vitals: Temperature 97.6 pulse 87 respiratory rate 20 blood pressure 147/70 saturations 98%  Ventilator Settings mode of ventilation pressure support FiO2 35% tidal volume 398cc PEEP is 5  . General: Comfortable at this time . Eyes: Grossly normal lids, irises & conjunctiva . ENT: grossly tongue is normal . Neck: no obvious mass . Cardiovascular: S1 S2 normal no gallop . Respiratory: No rhonchi or rales are noted . Abdomen: soft . Skin: no rash seen on limited exam . Musculoskeletal: not rigid . Psychiatric:unable to assess . Neurologic: no seizure no involuntary movements         Lab Data:   Basic Metabolic Panel: Recent Labs  Lab 11/22/17 0726  NA 140  K 3.6  CL 98  CO2 33*  GLUCOSE 96  BUN 14  CREATININE 0.40*  CALCIUM 9.1    ABG: No results for input(s): PHART, PCO2ART, PO2ART, HCO3, O2SAT in the last 168 hours.  Liver Function Tests: No results for input(s): AST, ALT, ALKPHOS, BILITOT, PROT, ALBUMIN in the last 168 hours. No results for input(s): LIPASE, AMYLASE in the last 168 hours. No results for input(s): AMMONIA in the last 168 hours.  CBC: Recent Labs  Lab 11/22/17 0726  WBC 8.4  HGB 9.4*  HCT 30.3*  MCV 103.8*  PLT 425*    Cardiac Enzymes: No results for input(s): CKTOTAL, CKMB, CKMBINDEX, TROPONINI in the last 168 hours.  BNP (last 3 results) No results for input(s): BNP in the last 8760 hours.  ProBNP (last 3 results) No  results for input(s): PROBNP in the last 8760 hours.  Radiological Exams: No results found.  Assessment/Plan Active Problems:   Acute on chronic respiratory failure with hypoxia (HCC)   Severe sepsis with septic shock (CODE) (HCC)   Chronic atrial fibrillation (HCC)   Metabolic encephalopathy   COPD, severe (HCC)   Aspiration pneumonia due to gastric secretions (HCC)   1. Acute on chronic respiratory failure with hypoxia patient right now is on pressure support wean we will try to continue to advance as tolerated. 2. Severe sepsis with shock hemodynamically stable continue present therapy. 3. Chronic atrial fibrillation rate is controlled we will follow along 4. Metabolic encephalopathy at baseline 5. Severe COPD at baseline continue present management 6. Pneumonia due to aspiration treated we will continue supportive care   I have personally seen and evaluated the patient, evaluated laboratory and imaging results, formulated the assessment and plan and placed orders. The Patient requires high complexity decision making for assessment and support.  Case was discussed on Rounds with the Respiratory Therapy Staff  Yevonne PaxSaadat A Lloyde Ludlam, MD [facility] Pulmonary Critical Care Medicine Sleep Medicine

## 2017-11-26 DIAGNOSIS — F05 Delirium due to known physiological condition: Secondary | ICD-10-CM

## 2017-11-26 NOTE — Consult Note (Signed)
[facility] Face-to-Face Psychiatry Consult   Reason for Consult: agitated, combative, delirium Referring Physician:  Dr. Sharyon Medicus Patient Identification: Daniel Irwin MRN:  [id number] Principal Diagnosis: Delirium due to another medical condition Diagnosis:   Patient Active Problem List   Diagnosis Date Noted  . Delirium due to another medical condition [F05] 11/26/2017  . Acute on chronic respiratory failure with hypoxia (HCC) [J96.21]   . Severe sepsis with septic shock (CODE) (HCC) [R65.21]   . Chronic atrial fibrillation (HCC) [I48.2]   . Metabolic encephalopathy [G93.41]   . COPD, severe (HCC) [J44.9]   . Aspiration pneumonia due to gastric secretions (HCC) [J69.0]     Total Time spent with patient: 1 hour  Subjective:   Daniel Irwin is a 62 y.o. male patient admitted with acute on chronic respiratory failure.  HPI:  Patient is currently on ventilation and as such unable to articulate what lead to his current admission. Informations were obtained from the patient's chart and treating team. Per chart, patient has history of stroke, COPD and a left basilar pneumonia. Patient was transferred to [facility] after a brief hospitalization. However, patient reportedly had a sudden change in his mental status. He became confused, combative, agitated, delirium and was intubated, sent for Head CT which did not show an acute stroke.Per report,  Post intubation chest  x-ray had shown worsening of his lung infiltrate with a complete white out and mucous plugging.  Patient was transferred to a tertiary care center and he was noted to have mucous plugging. As a result, he underwent a tracheostomy. However, patient has continued to be combative, confused, agitated and delirious.  Past Psychiatric History: anxiety disorder, depression, panic disorder per patient's chart  Risk to Self:   Risk to Others:   Prior Inpatient Therapy:   Prior Outpatient Therapy:    Past Medical History:  Past Medical History:   Diagnosis Date  . Acute on chronic respiratory failure with hypoxia (HCC)   . Aspiration pneumonia due to gastric secretions (HCC)   . Chronic atrial fibrillation (HCC)   . COPD, severe (HCC)   . Metabolic encephalopathy   . Severe sepsis with septic shock (CODE) (HCC)   . Smoker   . Stroke (cerebrum) [facility])     Past Surgical History:  Procedure Laterality Date  . TRACHEOSTOMY     Family History:  Family History  Family history unknown: Yes   Family Psychiatric  History:  Social History:  Social History   Substance and Sexual Activity  Alcohol Use Not Currently     Social History   Substance and Sexual Activity  Drug Use Not Currently    Social History   Socioeconomic History  . Marital status: Single    Spouse name: Not on file  . Number of children: Not on file  . Years of education: Not on file  . Highest education level: Not on file  Occupational History  . Not on file  Social Needs  . Financial resource strain: Not on file  . Food insecurity:    Worry: Not on file    Inability: Not on file  . Transportation needs:    Medical: Not on file    Non-medical: Not on file  Tobacco Use  . Smoking status: Unknown If Ever Smoked  . Smokeless tobacco: Never Used  Substance and Sexual Activity  . Alcohol use: Not Currently  . Drug use: Not Currently  . Sexual activity: Not Currently  Lifestyle  . Physical activity:    Days  per week: Not on file    Minutes per session: Not on file  . Stress: Not on file  Relationships  . Social connections:    Talks on phone: Not on file    Gets together: Not on file    Attends religious service: Not on file    Active member of club or organization: Not on file    Attends meetings of clubs or organizations: Not on file    Relationship status: Not on file  Other Topics Concern  . Not on file  Social History Narrative  . Not on file   Additional Social History:    Allergies:  Allergies not on file  Labs: No results  found for this or any previous visit (from the past 48 hour(s)).  No current facility-administered medications for this encounter.     Musculoskeletal: Strength & Muscle Tone: not tested Gait & Station: unable to stand Patient leans: N/A  Psychiatric Specialty Exam: Physical Exam  Psychiatric: His affect is blunt. He is agitated and combative. Cognition and memory are impaired. He expresses impulsivity. He is noncommunicative.    Review of Systems  Unable to perform ROS: Medical condition    There were no vitals taken for this visit.There is no height or weight on file to calculate BMI.  General Appearance: Casual  Eye Contact:  None  Speech:  Blocked  Volume:  none  Mood:  Irritable  Affect:  Blunt  Thought Process:  Disorganized  Orientation:  Other:  unable to assess  Thought Content:  Illogical  Suicidal Thoughts:  unable to assess  Homicidal Thoughts:  unable to assess  Memory:  unable to assess  Judgement:  Poor  Insight:  Lacking  Psychomotor Activity:  Increased and Restlessness  Concentration:  Concentration: Poor and Attention Span: Poor  Recall:  unable to assess  Fund of Knowledge:  unable to assess  Language:  Poor  Akathisia:  No  Handed:  Right  AIMS (if indicated):     Assets:  Social Support  ADL's:  Impaired  Cognition:  Impaired,  Sleep:        Treatment Plan Summary: Medication management  Continue Haloperidol 2 mg tid for agitation Add Hydroxyzine 25 mg twice daily bid for EPS prevention/anxiety and agitation. Re-consult as needed   Disposition: f/u in 1 week  Thedore MinsMojeed Jaielle Dlouhy, MD 11/26/2017 6:28 PM

## 2017-11-27 ENCOUNTER — Other Ambulatory Visit (HOSPITAL_COMMUNITY): Payer: Medicaid - Out of State

## 2017-11-27 LAB — BASIC METABOLIC PANEL
Anion gap: 10 (ref 5–15)
BUN: 25 mg/dL — AB (ref 8–23)
CHLORIDE: 92 mmol/L — AB (ref 98–111)
CO2: 34 mmol/L — ABNORMAL HIGH (ref 22–32)
Calcium: 9.4 mg/dL (ref 8.9–10.3)
Creatinine, Ser: 0.51 mg/dL — ABNORMAL LOW (ref 0.61–1.24)
GFR calc Af Amer: >60 mL/min (ref >60)
GFR calc non Af Amer: >60 mL/min (ref >60)
GLUCOSE: 186 mg/dL — AB (ref 70–99)
POTASSIUM: 4.2 mmol/L (ref 3.5–5.1)
SODIUM: 136 mmol/L (ref 135–145)

## 2017-11-27 LAB — CBC
HEMATOCRIT: 31.6 % — AB (ref 39.0–52.0)
Hemoglobin: 10 g/dL — ABNORMAL LOW (ref 13.0–17.0)
MCH: 32.6 pg (ref 26.0–34.0)
MCHC: 31.6 g/dL (ref 30.0–36.0)
MCV: 102.9 fL — AB (ref 78.0–100.0)
Platelets: 554 10*3/uL — ABNORMAL HIGH (ref 150–400)
RBC: 3.07 MIL/uL — ABNORMAL LOW (ref 4.22–5.81)
RDW: 14.3 % (ref 11.5–15.5)
WBC: 26.8 10*3/uL — AB (ref 4.0–10.5)

## 2017-11-28 LAB — CBC WITH DIFFERENTIAL/PLATELET
ABS IMMATURE GRANULOCYTES: 0.9 10*3/uL — AB (ref 0.0–0.1)
BASOS ABS: 0.2 10*3/uL — AB (ref 0.0–0.1)
Basophils Relative: 1 %
Eosinophils Absolute: 0.1 10*3/uL (ref 0.0–0.7)
Eosinophils Relative: 1 %
HCT: 29.7 % — ABNORMAL LOW (ref 39.0–52.0)
Hemoglobin: 9.1 g/dL — ABNORMAL LOW (ref 13.0–17.0)
Immature Granulocytes: 5 %
Lymphocytes Relative: 5 %
Lymphs Abs: 1 10*3/uL (ref 0.7–4.0)
MCH: 31.9 pg (ref 26.0–34.0)
MCHC: 30.6 g/dL (ref 30.0–36.0)
MCV: 104.2 fL — ABNORMAL HIGH (ref 78.0–100.0)
Monocytes Absolute: 1.7 10*3/uL — ABNORMAL HIGH (ref 0.1–1.0)
Monocytes Relative: 9 %
NEUTROS ABS: 14.9 10*3/uL — AB (ref 1.7–7.7)
Neutrophils Relative %: 79 %
Platelets: 424 10*3/uL — ABNORMAL HIGH (ref 150–400)
RBC: 2.85 MIL/uL — AB (ref 4.22–5.81)
RDW: 14.6 % (ref 11.5–15.5)
WBC: 18.7 10*3/uL — AB (ref 4.0–10.5)

## 2017-11-29 DIAGNOSIS — J449 Chronic obstructive pulmonary disease, unspecified: Secondary | ICD-10-CM | POA: Diagnosis not present

## 2017-11-29 DIAGNOSIS — J690 Pneumonitis due to inhalation of food and vomit: Secondary | ICD-10-CM | POA: Diagnosis not present

## 2017-11-29 DIAGNOSIS — I482 Chronic atrial fibrillation: Secondary | ICD-10-CM | POA: Diagnosis not present

## 2017-11-29 DIAGNOSIS — J9621 Acute and chronic respiratory failure with hypoxia: Secondary | ICD-10-CM | POA: Diagnosis not present

## 2017-11-29 NOTE — Progress Notes (Signed)
[facility]   PULMONARY CRITICAL CARE SERVICE  PROGRESS NOTE  Date of Service: 11/29/2017  Daniel DamesDavid Alban  ZOX:[id number]  DOB: 07-23-1955   DOA: 11/15/2017  Referring Physician: Carron CurieAli Hijazi, MD  HPI: Daniel Irwin is a 62 y.o. male seen for follow up of Acute on Chronic Respiratory Failure.  Patient remains on the ventilator full support still very confused and agitated.  Patient was seen by psychiatry recommended Haldol for agitation and hydroxyzine  Medications: Reviewed on Rounds  Physical Exam:  Vitals: Temperature 98.7 pulse 91 respiratory 24 blood pressure 104 67 saturation 95%  Ventilator Settings mode of ventilation assist control FiO2 35% tidal volume 451 PEEP 5  . General: Comfortable at this time . Eyes: Grossly normal lids, irises & conjunctiva . ENT: grossly tongue is normal . Neck: no obvious mass . Cardiovascular: S1 S2 normal no gallop . Respiratory: No rhonchi or rales are noted . Abdomen: soft . Skin: no rash seen on limited exam . Musculoskeletal: not rigid . Psychiatric:unable to assess . Neurologic: no seizure no involuntary movements         Lab Data:   Basic Metabolic Panel: Recent Labs  Lab 11/27/17 0650  NA 136  K 4.2  CL 92*  CO2 34*  GLUCOSE 186*  BUN 25*  CREATININE 0.51*  CALCIUM 9.4    ABG: No results for input(s): PHART, PCO2ART, PO2ART, HCO3, O2SAT in the last 168 hours.  Liver Function Tests: No results for input(s): AST, ALT, ALKPHOS, BILITOT, PROT, ALBUMIN in the last 168 hours. No results for input(s): LIPASE, AMYLASE in the last 168 hours. No results for input(s): AMMONIA in the last 168 hours.  CBC: Recent Labs  Lab 11/27/17 0650 11/28/17 0657  WBC 26.8* 18.7*  NEUTROABS  --  14.9*  HGB 10.0* 9.1*  HCT 31.6* 29.7*  MCV 102.9* 104.2*  PLT 554* 424*    Cardiac Enzymes: No results for input(s): CKTOTAL, CKMB, CKMBINDEX, TROPONINI in the last 168 hours.  BNP  (last 3 results) No results for input(s): BNP in the last 8760 hours.  ProBNP (last 3 results) No results for input(s): PROBNP in the last 8760 hours.  Radiological Exams: No results found.  Assessment/Plan Principal Problem:   Acute on chronic respiratory failure with hypoxia (HCC) Active Problems:   Severe sepsis with septic shock (CODE) (HCC)   Chronic atrial fibrillation (HCC)   Metabolic encephalopathy   COPD, severe (HCC)   Aspiration pneumonia due to gastric secretions (HCC)   Delirium due to another medical condition   1. Acute on chronic respiratory failure with hypoxia continue with assist control mode continue pulmonary toilet secretion management.  Patient's tidal volume is 451 with a PEEP 5 patient is not able to tolerate weaning. 2. Severe sepsis hemodynamically stable resolved continue present management. 3. Chronic atrial fibrillation rate is controlled we will continue with present management. 4. Metabolic encephalopathy grossly unchanged 5. Severe COPD continue with supportive care 6. Pneumonia due to aspiration treated with antibiotics   I have personally seen and evaluated the patient, evaluated laboratory and imaging results, formulated the assessment and plan and placed orders. The Patient requires high complexity decision making for assessment and support.  Case was discussed on Rounds with the Respiratory Therapy Staff  Yevonne PaxSaadat A Khan, MD [facility] Pulmonary Critical Care Medicine Sleep Medicine

## 2017-11-30 DIAGNOSIS — J690 Pneumonitis due to inhalation of food and vomit: Secondary | ICD-10-CM | POA: Diagnosis not present

## 2017-11-30 DIAGNOSIS — I482 Chronic atrial fibrillation: Secondary | ICD-10-CM | POA: Diagnosis not present

## 2017-11-30 DIAGNOSIS — J9621 Acute and chronic respiratory failure with hypoxia: Secondary | ICD-10-CM | POA: Diagnosis not present

## 2017-11-30 DIAGNOSIS — J449 Chronic obstructive pulmonary disease, unspecified: Secondary | ICD-10-CM | POA: Diagnosis not present

## 2017-11-30 NOTE — Progress Notes (Signed)
[facility]   PULMONARY CRITICAL CARE SERVICE  PROGRESS NOTE  Date of Service: 11/30/2017  Daniel Irwin  ZOX:[id number]  DOB: 1956-02-26   DOA: 11/15/2017  Referring Physician: Carron CurieAli Hijazi, MD  HPI: Daniel DamesDavid Irwin is a 62 y.o. male seen for follow up of Acute on Chronic Respiratory Failure.  Patient is weaning right now on pressure support goal is for 12 hours today looks good currently is on 35% oxygen  Medications: Reviewed on Rounds  Physical Exam:  Vitals: Temperature 96.2 pulse 71 respiratory 25 blood pressure is 106/51 saturations 99%  Ventilator Settings mode of ventilation pressure support FiO2 35% tidal volume 629 pressure support 12 PEEP 5  . General: Comfortable at this time . Eyes: Grossly normal lids, irises & conjunctiva . ENT: grossly tongue is normal . Neck: no obvious mass . Cardiovascular: S1 S2 normal no gallop . Respiratory: No rhonchi no rales are noted at this time . Abdomen: soft . Skin: no rash seen on limited exam . Musculoskeletal: not rigid . Psychiatric:unable to assess . Neurologic: no seizure no involuntary movements         Lab Data:   Basic Metabolic Panel: Recent Labs  Lab 11/27/17 0650  NA 136  K 4.2  CL 92*  CO2 34*  GLUCOSE 186*  BUN 25*  CREATININE 0.51*  CALCIUM 9.4    ABG: No results for input(s): PHART, PCO2ART, PO2ART, HCO3, O2SAT in the last 168 hours.  Liver Function Tests: No results for input(s): AST, ALT, ALKPHOS, BILITOT, PROT, ALBUMIN in the last 168 hours. No results for input(s): LIPASE, AMYLASE in the last 168 hours. No results for input(s): AMMONIA in the last 168 hours.  CBC: Recent Labs  Lab 11/27/17 0650 11/28/17 0657  WBC 26.8* 18.7*  NEUTROABS  --  14.9*  HGB 10.0* 9.1*  HCT 31.6* 29.7*  MCV 102.9* 104.2*  PLT 554* 424*    Cardiac Enzymes: No results for input(s): CKTOTAL, CKMB, CKMBINDEX, TROPONINI in the last 168 hours.  BNP (last 3  results) No results for input(s): BNP in the last 8760 hours.  ProBNP (last 3 results) No results for input(s): PROBNP in the last 8760 hours.  Radiological Exams: No results found.  Assessment/Plan Principal Problem:   Acute on chronic respiratory failure with hypoxia (HCC) Active Problems:   Severe sepsis with septic shock (CODE) (HCC)   Chronic atrial fibrillation (HCC)   Metabolic encephalopathy   COPD, severe (HCC)   Aspiration pneumonia due to gastric secretions (HCC)   Delirium due to another medical condition   1. Acute on chronic respiratory failure with hypoxia weaning on protocol we will continue to advance goal of 12 hours today.  Continue pulmonary toilet supportive care 2. Chronic atrial fibrillation rate is controlled we will continue with present management. 3. Metabolic encephalopathy grossly unchanged 4. Severe COPD continue with present management. 5. Pneumonia due to aspiration treated we will follow   I have personally seen and evaluated the patient, evaluated laboratory and imaging results, formulated the assessment and plan and placed orders. The Patient requires high complexity decision making for assessment and support.  Case was discussed on Rounds with the Respiratory Therapy Staff  Yevonne PaxSaadat A Heidy Mccubbin, MD [facility] Pulmonary Critical Care Medicine Sleep Medicine

## 2017-12-01 DIAGNOSIS — J9621 Acute and chronic respiratory failure with hypoxia: Secondary | ICD-10-CM | POA: Diagnosis not present

## 2017-12-01 DIAGNOSIS — J690 Pneumonitis due to inhalation of food and vomit: Secondary | ICD-10-CM | POA: Diagnosis not present

## 2017-12-01 DIAGNOSIS — J449 Chronic obstructive pulmonary disease, unspecified: Secondary | ICD-10-CM | POA: Diagnosis not present

## 2017-12-01 DIAGNOSIS — I482 Chronic atrial fibrillation: Secondary | ICD-10-CM | POA: Diagnosis not present

## 2017-12-01 NOTE — Progress Notes (Signed)
[facility]   PULMONARY CRITICAL CARE SERVICE  PROGRESS NOTE  Date of Service: 12/01/2017  Daniel DamesDavid Gan  OZH:[id number]  DOB: 06-09-1955   DOA: 11/15/2017  Referring Physician: Carron CurieAli Hijazi, MD  HPI: Daniel Irwin is a 62 y.o. male seen for follow up of Acute on Chronic Respiratory Failure.  Currently is on pressure support mode has been weaning tolerating it fairly well.  Right now is requiring 30% oxygen  Medications: Reviewed on Rounds  Physical Exam:  Vitals: Temperature 98.4 pulse 80 respiratory rate 18 blood pressure 111/51 saturations 98%  Ventilator Settings mode of ventilation pressure support FiO2 30% tidal volume 437 pressure support 12 PEEP 5  . General: Comfortable at this time . Eyes: Grossly normal lids, irises & conjunctiva . ENT: grossly tongue is normal . Neck: no obvious mass . Cardiovascular: S1 S2 normal no gallop . Respiratory: No rhonchi or rales are noted . Abdomen: soft . Skin: no rash seen on limited exam . Musculoskeletal: not rigid . Psychiatric:unable to assess . Neurologic: no seizure no involuntary movements         Lab Data:   Basic Metabolic Panel: Recent Labs  Lab 11/27/17 0650  NA 136  K 4.2  CL 92*  CO2 34*  GLUCOSE 186*  BUN 25*  CREATININE 0.51*  CALCIUM 9.4    ABG: No results for input(s): PHART, PCO2ART, PO2ART, HCO3, O2SAT in the last 168 hours.  Liver Function Tests: No results for input(s): AST, ALT, ALKPHOS, BILITOT, PROT, ALBUMIN in the last 168 hours. No results for input(s): LIPASE, AMYLASE in the last 168 hours. No results for input(s): AMMONIA in the last 168 hours.  CBC: Recent Labs  Lab 11/27/17 0650 11/28/17 0657  WBC 26.8* 18.7*  NEUTROABS  --  14.9*  HGB 10.0* 9.1*  HCT 31.6* 29.7*  MCV 102.9* 104.2*  PLT 554* 424*    Cardiac Enzymes: No results for input(s): CKTOTAL, CKMB, CKMBINDEX, TROPONINI in the last 168 hours.  BNP (last 3  results) No results for input(s): BNP in the last 8760 hours.  ProBNP (last 3 results) No results for input(s): PROBNP in the last 8760 hours.  Radiological Exams: No results found.  Assessment/Plan Principal Problem:   Acute on chronic respiratory failure with hypoxia (HCC) Active Problems:   Severe sepsis with septic shock (CODE) (HCC)   Chronic atrial fibrillation (HCC)   Metabolic encephalopathy   COPD, severe (HCC)   Aspiration pneumonia due to gastric secretions (HCC)   Delirium due to another medical condition   1. Acute on chronic respiratory failure with hypoxia we will continue with full support on the pressure support.  The weaning yesterday was 12 hours today the goal should be about 16 hours. 2. Severe sepsis resolved hemodynamically stable. 3. Chronic atrial fibrillation rate is controlled 4. Metabolic encephalopathy at baseline 5. Severe COPD continue with present management 6. Pneumonia due to aspiration treated resolved   I have personally seen and evaluated the patient, evaluated laboratory and imaging results, formulated the assessment and plan and placed orders. The Patient requires high complexity decision making for assessment and support.  Case was discussed on Rounds with the Respiratory Therapy Staff  Yevonne PaxSaadat A Khan, MD [facility] Pulmonary Critical Care Medicine Sleep Medicine

## 2017-12-02 DIAGNOSIS — J690 Pneumonitis due to inhalation of food and vomit: Secondary | ICD-10-CM | POA: Diagnosis not present

## 2017-12-02 DIAGNOSIS — I482 Chronic atrial fibrillation: Secondary | ICD-10-CM | POA: Diagnosis not present

## 2017-12-02 DIAGNOSIS — J9621 Acute and chronic respiratory failure with hypoxia: Secondary | ICD-10-CM | POA: Diagnosis not present

## 2017-12-02 DIAGNOSIS — J449 Chronic obstructive pulmonary disease, unspecified: Secondary | ICD-10-CM | POA: Diagnosis not present

## 2017-12-02 LAB — URINE CULTURE: Culture: 60000 — AB

## 2017-12-02 NOTE — Progress Notes (Signed)
[facility]   PULMONARY CRITICAL CARE SERVICE  PROGRESS NOTE  Date of Service: 12/02/2017  Daniel DamesDavid Roller  ZOX:[id number]  DOB: August 29, 1955   DOA: 11/15/2017  Referring Physician: Carron CurieAli Hijazi, MD  HPI: Daniel DamesDavid Irwin is a 62 y.o. male seen for follow up of Acute on Chronic Respiratory Failure.  Patient continues on full support right now was weaning on pressure support yesterday today the goal is to do 16 hours on pressure support  Medications: Reviewed on Rounds  Physical Exam:  Vitals: Temperature 98.1 pulse 84 respiratory rate 26 blood pressure 150/66 saturations 98%  Ventilator Settings mode of ventilation assist control FiO2 28% tidal volume 549 PEEP 5  . General: Comfortable at this time . Eyes: Grossly normal lids, irises & conjunctiva . ENT: grossly tongue is normal . Neck: no obvious mass . Cardiovascular: S1 S2 normal no gallop . Respiratory: Good aeration no rhonchi . Abdomen: soft . Skin: no rash seen on limited exam . Musculoskeletal: not rigid . Psychiatric:unable to assess . Neurologic: no seizure no involuntary movements         Lab Data:   Basic Metabolic Panel: Recent Labs  Lab 11/27/17 0650  NA 136  K 4.2  CL 92*  CO2 34*  GLUCOSE 186*  BUN 25*  CREATININE 0.51*  CALCIUM 9.4    ABG: No results for input(s): PHART, PCO2ART, PO2ART, HCO3, O2SAT in the last 168 hours.  Liver Function Tests: No results for input(s): AST, ALT, ALKPHOS, BILITOT, PROT, ALBUMIN in the last 168 hours. No results for input(s): LIPASE, AMYLASE in the last 168 hours. No results for input(s): AMMONIA in the last 168 hours.  CBC: Recent Labs  Lab 11/27/17 0650 11/28/17 0657  WBC 26.8* 18.7*  NEUTROABS  --  14.9*  HGB 10.0* 9.1*  HCT 31.6* 29.7*  MCV 102.9* 104.2*  PLT 554* 424*    Cardiac Enzymes: No results for input(s): CKTOTAL, CKMB, CKMBINDEX, TROPONINI in the last 168 hours.  BNP (last 3 results) No  results for input(s): BNP in the last 8760 hours.  ProBNP (last 3 results) No results for input(s): PROBNP in the last 8760 hours.  Radiological Exams: No results found.  Assessment/Plan Principal Problem:   Acute on chronic respiratory failure with hypoxia (HCC) Active Problems:   Severe sepsis with septic shock (CODE) (HCC)   Chronic atrial fibrillation (HCC)   Metabolic encephalopathy   COPD, severe (HCC)   Aspiration pneumonia due to gastric secretions (HCC)   Delirium due to another medical condition   1. Acute on chronic respiratory failure with hypoxia continue doing supportive.  Patient has been tolerating pressure support fairly well the goal is for 16 hours. 2. Chronic atrial fibrillation rate is controlled we will continue present management 3. Metabolic encephalopathy grossly unchanged continue with supportive care. 4. Severe COPD at baseline we will continue to follow 5. Pneumonia due to aspiration treated follow-up x-ray as necessary 6. Severe sepsis hemodynamically stable at this time we will continue present management   I have personally seen and evaluated the patient, evaluated laboratory and imaging results, formulated the assessment and plan and placed orders. The Patient requires high complexity decision making for assessment and support.  Case was discussed on Rounds with the Respiratory Therapy Staff  Yevonne PaxSaadat A Khan, MD [facility] Pulmonary Critical Care Medicine Sleep Medicine

## 2017-12-03 ENCOUNTER — Other Ambulatory Visit (HOSPITAL_COMMUNITY): Payer: Medicaid - Out of State

## 2017-12-03 DIAGNOSIS — J690 Pneumonitis due to inhalation of food and vomit: Secondary | ICD-10-CM | POA: Diagnosis not present

## 2017-12-03 DIAGNOSIS — J9621 Acute and chronic respiratory failure with hypoxia: Secondary | ICD-10-CM | POA: Diagnosis not present

## 2017-12-03 DIAGNOSIS — J449 Chronic obstructive pulmonary disease, unspecified: Secondary | ICD-10-CM | POA: Diagnosis not present

## 2017-12-03 DIAGNOSIS — I482 Chronic atrial fibrillation: Secondary | ICD-10-CM | POA: Diagnosis not present

## 2017-12-03 LAB — CULTURE, BLOOD (ROUTINE X 2)
CULTURE: NO GROWTH
Culture: NO GROWTH
SPECIAL REQUESTS: ADEQUATE
Special Requests: ADEQUATE

## 2017-12-03 NOTE — Progress Notes (Signed)
[facility] Critical Care [facility]   PULMONARY CRITICAL CARE SERVICE  PROGRESS NOTE  Date of Service: 12/03/2017  Arther DamesDavid Mccarter  WUJ:[id number]  DOB: 08/17/1955   DOA: 11/15/2017  Referring Physician: Carron CurieAli Hijazi, MD  HPI: Arther DamesDavid Carstarphen is a 62 y.o. male seen for follow up of Acute on Chronic Respiratory Failure.  Patient still remains agitated.  He has been having a lot of restlessness of his legs.  He is also had expressive movements almost appears to be parkinsonian type movements.  Patient right now is on 100% oxygen which I am not sure why that occurred but we did wean him back down and he actually maintained his saturations.  Medications: Reviewed on Rounds  Physical Exam:  Vitals: Temperature 98.3 pulse 93 respiratory rate 22 blood pressure 127/77 saturations are 96%  Ventilator Settings mode of ventilation assist control currently FiO2 was 100% tidal volume 435 PEEP 6  . General: Comfortable at this time . Eyes: Grossly normal lids, irises & conjunctiva . ENT: grossly tongue is normal . Neck: no obvious mass . Cardiovascular: S1 S2 normal no gallop . Respiratory: No rhonchi or rales are noted at this time. . Abdomen: soft . Skin: no rash seen on limited exam . Musculoskeletal: not rigid . Psychiatric:unable to assess . Neurologic: no seizure no involuntary movements         Lab Data:   Basic Metabolic Panel: Recent Labs  Lab 11/27/17 0650  NA 136  K 4.2  CL 92*  CO2 34*  GLUCOSE 186*  BUN 25*  CREATININE 0.51*  CALCIUM 9.4    ABG: No results for input(s): PHART, PCO2ART, PO2ART, HCO3, O2SAT in the last 168 hours.  Liver Function Tests: No results for input(s): AST, ALT, ALKPHOS, BILITOT, PROT, ALBUMIN in the last 168 hours. No results for input(s): LIPASE, AMYLASE in the last 168 hours. No results for input(s): AMMONIA in the last 168 hours.  CBC: Recent Labs  Lab 11/27/17 0650 11/28/17 0657  WBC 26.8* 18.7*  NEUTROABS   --  14.9*  HGB 10.0* 9.1*  HCT 31.6* 29.7*  MCV 102.9* 104.2*  PLT 554* 424*    Cardiac Enzymes: No results for input(s): CKTOTAL, CKMB, CKMBINDEX, TROPONINI in the last 168 hours.  BNP (last 3 results) No results for input(s): BNP in the last 8760 hours.  ProBNP (last 3 results) No results for input(s): PROBNP in the last 8760 hours.  Radiological Exams: No results found.  Assessment/Plan Principal Problem:   Acute on chronic respiratory failure with hypoxia (HCC) Active Problems:   Severe sepsis with septic shock (CODE) (HCC)   Chronic atrial fibrillation (HCC)   Metabolic encephalopathy   COPD, severe (HCC)   Aspiration pneumonia due to gastric secretions (HCC)   Delirium due to another medical condition   1. Acute on chronic respiratory failure with hypoxia we will continue with supportive care on the ventilator as noted above wean FiO2 down immediately.  Will reassess the wean potential. 2. Metabolic encephalopathy severe disease continue with present management. 3. Severe COPD will continue to monitor his management at baseline. 4. Pneumonia due to aspiration treated continue supportive care 5. Delirium unclear if this is also partly related to potential of parkinsonian type of disorder I would empirically try him on pramipexole because of his restlessness of his legs that have been repeatedly observed   I have personally seen and evaluated the patient, evaluated laboratory and imaging results, formulated the assessment and plan and placed orders. The  Patient requires high complexity decision making for assessment and support.  Case was discussed on Rounds with the Respiratory Therapy Staff  Allyne Gee, MD [facility] Pulmonary Critical Care Medicine Sleep Medicine

## 2017-12-04 DIAGNOSIS — J449 Chronic obstructive pulmonary disease, unspecified: Secondary | ICD-10-CM | POA: Diagnosis not present

## 2017-12-04 DIAGNOSIS — J9621 Acute and chronic respiratory failure with hypoxia: Secondary | ICD-10-CM | POA: Diagnosis not present

## 2017-12-04 DIAGNOSIS — I482 Chronic atrial fibrillation: Secondary | ICD-10-CM | POA: Diagnosis not present

## 2017-12-04 DIAGNOSIS — J690 Pneumonitis due to inhalation of food and vomit: Secondary | ICD-10-CM | POA: Diagnosis not present

## 2017-12-04 NOTE — Progress Notes (Signed)
[facility]   PULMONARY CRITICAL CARE SERVICE  PROGRESS NOTE  Date of Service: 12/04/2017  Daniel Irwin  ZOX:[id number]  DOB: 04-08-1955   DOA: 11/15/2017  Referring Physician: Carron CurieAli Hijazi, MD  HPI: Daniel Irwin is a 62 y.o. male seen for follow up of Acute on Chronic Respiratory Failure.  Patient is on full vent support.  Has been failing spontaneous breathing trial today was not placed on T collar  Medications: Reviewed on Rounds  Physical Exam:  Vitals: Temperature 97.4 pulse 63 respiratory rate 20 blood pressure 122/57 saturations 96%  Ventilator Settings currently is on assist control FiO2 35% tidal volume 379 PEEP 5  . General: Comfortable at this time . Eyes: Grossly normal lids, irises & conjunctiva . ENT: grossly tongue is normal . Neck: no obvious mass . Cardiovascular: S1 S2 normal no gallop . Respiratory: No rhonchi or rales are noted . Abdomen: soft . Skin: no rash seen on limited exam . Musculoskeletal: not rigid . Psychiatric:unable to assess . Neurologic: no seizure no involuntary movements         Lab Data:   Basic Metabolic Panel: No results for input(s): NA, K, CL, CO2, GLUCOSE, BUN, CREATININE, CALCIUM, MG, PHOS in the last 168 hours.  ABG: No results for input(s): PHART, PCO2ART, PO2ART, HCO3, O2SAT in the last 168 hours.  Liver Function Tests: No results for input(s): AST, ALT, ALKPHOS, BILITOT, PROT, ALBUMIN in the last 168 hours. No results for input(s): LIPASE, AMYLASE in the last 168 hours. No results for input(s): AMMONIA in the last 168 hours.  CBC: Recent Labs  Lab 11/28/17 0657  WBC 18.7*  NEUTROABS 14.9*  HGB 9.1*  HCT 29.7*  MCV 104.2*  PLT 424*    Cardiac Enzymes: No results for input(s): CKTOTAL, CKMB, CKMBINDEX, TROPONINI in the last 168 hours.  BNP (last 3 results) No results for input(s): BNP in the last 8760 hours.  ProBNP (last 3 results) No results for  input(s): PROBNP in the last 8760 hours.  Radiological Exams: Ct Abdomen Wo Contrast  Result Date: 12/03/2017 CLINICAL DATA:  Preop planning for gastrostomy placement EXAM: CT ABDOMEN WITHOUT CONTRAST TECHNIQUE: Multidetector CT imaging of the abdomen was performed following the standard protocol without IV contrast. COMPARISON:  None. FINDINGS: Lower chest: Small scattered nodular subpleural opacities in the posterior left lower lobe likely infectious/inflammatory. No pleural or pericardial effusion. Hepatobiliary: No focal liver abnormality is seen. No gallstones, gallbladder wall thickening, or biliary dilatation. Pancreas: Unremarkable. No pancreatic ductal dilatation or surrounding inflammatory changes. Spleen: Normal in size without focal abnormality. Adrenals/Urinary Tract: Bilateral adrenal hypertrophy. Bilateral nephrolithiasis, largest stone or cluster 6 mm in the mid right renal collecting system. No hydronephrosis or proximal ureterectasis. No focal renal lesion identified. Stomach/Bowel: Nasogastric tube partially decompresses the stomach. There is a safe percutaneous window evident for gastrostomy placement. Visualized portions of small bowel and colon are nondilated. Vascular/Lymphatic: Minimal aortic calcifications without aneurysm. No adenopathy localized. Other: No ascites.  No free air. Musculoskeletal: Healing left 10,11,12th rib fractures. Healing L2 and L3 left transverse process fractures. compression fracture deformity of L1 with estimated 25% loss of height anteriorly, no retropulsion or posterior element involvement. Degenerative disc disease L4-5, L5-S1. IMPRESSION: 1. There exists a safe percutaneous window for percutaneous gastrostomy placement. 2. Bilateral nephrolithiasis without hydronephrosis. 3. Scattered nodular airspace opacities in the visualized left lung base, favor infectious/inflammatory etiology. 4. L1 compression fracture deformity, age indeterminate. 5. Healing left  rib and lumbar transverse  process fractures Electronically Signed   By: Corlis Leak M.D.   On: 12/03/2017 12:59    Assessment/Plan Principal Problem:   Acute on chronic respiratory failure with hypoxia (HCC) Active Problems:   Severe sepsis with septic shock (CODE) (HCC)   Chronic atrial fibrillation (HCC)   Metabolic encephalopathy   COPD, severe (HCC)   Aspiration pneumonia due to gastric secretions (HCC)   Delirium due to another medical condition   1. Acute on chronic respiratory failure with hypoxia we will continue with full support on assist control titrate oxygen as tolerated.  Patient's spontaneous breathing trial will be attempted again 2. Severe sepsis treated we will continue to follow 3. Chronic atrial fibrillation rate is controlled 4. Metabolic encephalopathy at baseline 5. Severe COPD continue present management 6. Pneumonia due to aspiration treated we will continue to follow-up on the x-rays   I have personally seen and evaluated the patient, evaluated laboratory and imaging results, formulated the assessment and plan and placed orders. The Patient requires high complexity decision making for assessment and support.  Case was discussed on Rounds with the Respiratory Therapy Staff  Yevonne Pax, MD [facility] Pulmonary Critical Care Medicine Sleep Medicine

## 2017-12-05 DIAGNOSIS — J690 Pneumonitis due to inhalation of food and vomit: Secondary | ICD-10-CM | POA: Diagnosis not present

## 2017-12-05 DIAGNOSIS — I482 Chronic atrial fibrillation: Secondary | ICD-10-CM | POA: Diagnosis not present

## 2017-12-05 DIAGNOSIS — J9621 Acute and chronic respiratory failure with hypoxia: Secondary | ICD-10-CM | POA: Diagnosis not present

## 2017-12-05 DIAGNOSIS — J449 Chronic obstructive pulmonary disease, unspecified: Secondary | ICD-10-CM | POA: Diagnosis not present

## 2017-12-05 NOTE — Progress Notes (Signed)
[facility]   PULMONARY CRITICAL CARE SERVICE  PROGRESS NOTE  Date of Service: 12/05/2017  Daniel Irwin  AVW:[id number]  DOB: [birth year]   DOA: 11/15/2017  Referring Physician: Carron CurieAli Hijazi, MD  HPI: Daniel Irwin is a 62 y.o. male seen for follow up of Acute on Chronic Respiratory Failure.  Patient is doing well on T collar currently on 28% FiO2 sitting up in bed  Medications: Reviewed on Rounds  Physical Exam:  Vitals: Temperature 97.8 pulse 81 respiratory 24 blood pressure 99/52 saturation 98%  Ventilator Settings currently off the ventilator on T collar  . General: Comfortable at this time . Eyes: Grossly normal lids, irises & conjunctiva . ENT: grossly tongue is normal . Neck: no obvious mass . Cardiovascular: S1 S2 normal no gallop . Respiratory: No rhonchi no rales . Abdomen: soft . Skin: no rash seen on limited exam . Musculoskeletal: not rigid . Psychiatric:unable to assess . Neurologic: no seizure no involuntary movements         Lab Data:   Basic Metabolic Panel: No results for input(s): NA, K, CL, CO2, GLUCOSE, BUN, CREATININE, CALCIUM, MG, PHOS in the last 168 hours.  ABG: No results for input(s): PHART, PCO2ART, PO2ART, HCO3, O2SAT in the last 168 hours.  Liver Function Tests: No results for input(s): AST, ALT, ALKPHOS, BILITOT, PROT, ALBUMIN in the last 168 hours. No results for input(s): LIPASE, AMYLASE in the last 168 hours. No results for input(s): AMMONIA in the last 168 hours.  CBC: No results for input(s): WBC, NEUTROABS, HGB, HCT, MCV, PLT in the last 168 hours.  Cardiac Enzymes: No results for input(s): CKTOTAL, CKMB, CKMBINDEX, TROPONINI in the last 168 hours.  BNP (last 3 results) No results for input(s): BNP in the last 8760 hours.  ProBNP (last 3 results) No results for input(s): PROBNP in the last 8760 hours.  Radiological Exams: No results found.  Assessment/Plan Principal  Problem:   Acute on chronic respiratory failure with hypoxia (HCC) Active Problems:   Severe sepsis with septic shock (CODE) (HCC)   Chronic atrial fibrillation (HCC)   Metabolic encephalopathy   COPD, severe (HCC)   Aspiration pneumonia due to gastric secretions (HCC)   Delirium due to another medical condition   1. Acute on chronic respiratory failure with hypoxia continue weaning on T collar as ordered.  Titrate oxygen as tolerated continue with aggressive pulmonary toilet. 2. Severe sepsis hemodynamically stable 3. Chronic atrial fibrillation rate is controlled 4. Metabolic encephalopathy at baseline we will continue supportive care 5. Severe COPD continue present management 6. Aspiration pneumonia we will continue with aspiration precautions   I have personally seen and evaluated the patient, evaluated laboratory and imaging results, formulated the assessment and plan and placed orders. The Patient requires high complexity decision making for assessment and support.  Case was discussed on Rounds with the Respiratory Therapy Staff  Yevonne PaxSaadat A Siona Coulston, MD [facility] Pulmonary Critical Care Medicine Sleep Medicine

## 2017-12-06 ENCOUNTER — Institutional Professional Consult (permissible substitution) (HOSPITAL_COMMUNITY): Payer: Medicaid - Out of State

## 2017-12-06 DIAGNOSIS — I482 Chronic atrial fibrillation: Secondary | ICD-10-CM | POA: Diagnosis not present

## 2017-12-06 DIAGNOSIS — J9621 Acute and chronic respiratory failure with hypoxia: Secondary | ICD-10-CM | POA: Diagnosis not present

## 2017-12-06 DIAGNOSIS — J690 Pneumonitis due to inhalation of food and vomit: Secondary | ICD-10-CM | POA: Diagnosis not present

## 2017-12-06 DIAGNOSIS — J449 Chronic obstructive pulmonary disease, unspecified: Secondary | ICD-10-CM | POA: Diagnosis not present

## 2017-12-06 LAB — CBC
HEMATOCRIT: 29.9 % — AB (ref 39.0–52.0)
HEMOGLOBIN: 9.3 g/dL — AB (ref 13.0–17.0)
MCH: 32.9 pg (ref 26.0–34.0)
MCHC: 31.1 g/dL (ref 30.0–36.0)
MCV: 105.7 fL — ABNORMAL HIGH (ref 78.0–100.0)
PLATELETS: 289 10*3/uL (ref 150–400)
RBC: 2.83 MIL/uL — AB (ref 4.22–5.81)
RDW: 14.5 % (ref 11.5–15.5)
WBC: 15.1 10*3/uL — ABNORMAL HIGH (ref 4.0–10.5)

## 2017-12-06 LAB — BASIC METABOLIC PANEL
Anion gap: 7 (ref 5–15)
BUN: 24 mg/dL — AB (ref 8–23)
CALCIUM: 9.8 mg/dL (ref 8.9–10.3)
CO2: 39 mmol/L — ABNORMAL HIGH (ref 22–32)
CREATININE: 0.49 mg/dL — AB (ref 0.61–1.24)
Chloride: 96 mmol/L — ABNORMAL LOW (ref 98–111)
GFR calc Af Amer: >60 mL/min (ref >60)
Glucose, Bld: 85 mg/dL (ref 70–99)
Potassium: 4.2 mmol/L (ref 3.5–5.1)
Sodium: 142 mmol/L (ref 135–145)

## 2017-12-06 LAB — PROTIME-INR
INR: 1.1
PROTHROMBIN TIME: 14.2 s (ref 11.4–15.2)

## 2017-12-06 NOTE — Progress Notes (Signed)
[facility]   PULMONARY CRITICAL CARE SERVICE  PROGRESS NOTE  Date of Service: 12/06/2017  Arther DamesDavid Aloisi  ZOX:[id number]  DOB: 07/12/1955   DOA: 11/15/2017  Referring Physician: Carron CurieAli Hijazi, MD  HPI: Arther DamesDavid Duquette is a 62 y.o. male seen for follow up of Acute on Chronic Respiratory Failure.  Doing well with the T collar right now is on 35% oxygen.  Good saturations are noted.  We should be able to hopefully wean the oxygen level down  Medications: Reviewed on Rounds  Physical Exam:  Vitals: Temperature 97.2 pulse 88 respiratory rate 20 blood pressure 105/56 saturations 96%  Ventilator Settings off the ventilator on T collar  . General: Comfortable at this time . Eyes: Grossly normal lids, irises & conjunctiva . ENT: grossly tongue is normal . Neck: no obvious mass . Cardiovascular: S1 S2 normal no gallop . Respiratory: No rhonchi no rales . Abdomen: soft . Skin: no rash seen on limited exam . Musculoskeletal: not rigid . Psychiatric:unable to assess . Neurologic: no seizure no involuntary movements         Lab Data:   Basic Metabolic Panel: Recent Labs  Lab 12/06/17 0720  NA 142  K 4.2  CL 96*  CO2 39*  GLUCOSE 85  BUN 24*  CREATININE 0.49*  CALCIUM 9.8    ABG: No results for input(s): PHART, PCO2ART, PO2ART, HCO3, O2SAT in the last 168 hours.  Liver Function Tests: No results for input(s): AST, ALT, ALKPHOS, BILITOT, PROT, ALBUMIN in the last 168 hours. No results for input(s): LIPASE, AMYLASE in the last 168 hours. No results for input(s): AMMONIA in the last 168 hours.  CBC: Recent Labs  Lab 12/06/17 0720  WBC 15.1*  HGB 9.3*  HCT 29.9*  MCV 105.7*  PLT 289    Cardiac Enzymes: No results for input(s): CKTOTAL, CKMB, CKMBINDEX, TROPONINI in the last 168 hours.  BNP (last 3 results) No results for input(s): BNP in the last 8760 hours.  ProBNP (last 3 results) No results for input(s): PROBNP  in the last 8760 hours.  Radiological Exams: Dg Chest Port 1 View  Result Date: 12/06/2017 CLINICAL DATA:  Respiratory failure EXAM: PORTABLE CHEST 1 VIEW COMPARISON:  11/27/2017 FINDINGS: Cardiac shadow is within normal limits. Tracheostomy tube and nasogastric catheter are noted in satisfactory position. The lungs are hyperinflated. No focal infiltrate or sizable effusion is seen. IMPRESSION: COPD without acute abnormality Electronically Signed   By: Alcide CleverMark  Lukens M.D.   On: 12/06/2017 08:14    Assessment/Plan Principal Problem:   Acute on chronic respiratory failure with hypoxia (HCC) Active Problems:   Severe sepsis with septic shock (CODE) (HCC)   Chronic atrial fibrillation (HCC)   Metabolic encephalopathy   COPD, severe (HCC)   Aspiration pneumonia due to gastric secretions (HCC)   Delirium due to another medical condition   1. Acute on chronic respiratory failure with hypoxia we will continue with weaning the goal is 20 hours.  Or as tolerated. 2. Severe sepsis with shock hemodynamically stable continue present management 3. Melanotic encephalopathy grossly unchanged we will continue supportive care 4. Severe COPD present management will be continued 5. Aspiration precautions will remain in place for aspiration   I have personally seen and evaluated the patient, evaluated laboratory and imaging results, formulated the assessment and plan and placed orders. The Patient requires high complexity decision making for assessment and support.  Case was discussed on Rounds with the Respiratory Therapy Staff  [facility] Hospitalaadat  Richardson Dopp, MD [facility] Pulmonary Critical Care Medicine Sleep Medicine

## 2017-12-07 DIAGNOSIS — I482 Chronic atrial fibrillation: Secondary | ICD-10-CM | POA: Diagnosis not present

## 2017-12-07 DIAGNOSIS — J9621 Acute and chronic respiratory failure with hypoxia: Secondary | ICD-10-CM | POA: Diagnosis not present

## 2017-12-07 DIAGNOSIS — J449 Chronic obstructive pulmonary disease, unspecified: Secondary | ICD-10-CM | POA: Diagnosis not present

## 2017-12-07 DIAGNOSIS — J690 Pneumonitis due to inhalation of food and vomit: Secondary | ICD-10-CM | POA: Diagnosis not present

## 2017-12-07 NOTE — Consult Note (Signed)
Chief Complaint: Patient was seen in consultation today for percutaneous gastric tube placement at the request of Dr Sharyon MedicusHijazi  Supervising Physician: Malachy MoanMcCullough, Heath  Patient Status: [facility]  History of Present Illness: Daniel Irwin is a 62 y.o. male   Severe COPD Exacerbation Intubated Now trach [facility] for weaning and management  Resp failure Sepsis/PNA-- Aspiration Encephalopathy CVA Dysphagia Malnutrition Need for long term care  Request for percutaneous gastric tube placement IR Rad approved procedure Scheduled for 9/25-- if get consent  Past Medical History:  Diagnosis Date  . Acute on chronic respiratory failure with hypoxia (HCC)   . Aspiration pneumonia due to gastric secretions (HCC)   . Chronic atrial fibrillation (HCC)   . COPD, severe (HCC)   . Metabolic encephalopathy   . Severe sepsis with septic shock (CODE) (HCC)   . Smoker   . 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 TRACHEOSTOMY      Allergies: Patient has no allergy information on record.  Medications: Prior to Admission medications   Not on File     Family History  Family history unknown: Yes    Social History   Socioeconomic History  . Marital status: Single    Spouse name: Not on file  . Number of children: Not on file  . Years of education: Not on file  . Highest education level: Not on file  Occupational History  . Not on file  Social Needs  . Financial resource strain: Not on file  . Food insecurity:    Worry: Not on file    Inability: Not on file  . Transportation needs:    Medical: Not on file    Non-medical: Not on file  Tobacco Use  . Smoking status: Unknown If Ever Smoked  . Smokeless tobacco: Never Used  Substance and Sexual Activity  . Alcohol use: Not Currently  . Drug use: Not Currently  . Sexual activity: Not Currently  Lifestyle  . Physical activity:    Days per week: Not on file    Minutes per  session: Not on file  . Stress: Not on file  Relationships  . Social connections:    Talks on phone: Not on file    Gets together: Not on file    Attends religious service: Not on file    Active member of club or organization: Not on file    Attends meetings of clubs or organizations: Not on file    Relationship status: Not on file  Other Topics Concern  . Not on file  Social History Narrative  . Not on file    Review of Systems: A 12 point ROS discussed and pertinent positives are indicated in the HPI above.  All other systems are negative.  Review of Systems  Respiratory:       Trach  Psychiatric/Behavioral: Positive for decreased concentration.    Vital Signs: There were no vitals taken for this visit.  Physical Exam  Cardiovascular: Normal rate and regular rhythm.  Pulmonary/Chest: He has wheezes.  Abdominal: Soft.  Musculoskeletal:  Moves all 4s  Skin: Skin is warm and dry.  Psychiatric:  Called wife Unable to reach her-- could not LM  Vitals reviewed.   Imaging: Ct Abdomen Wo Contrast  Result Date: 12/03/2017 CLINICAL DATA:  Preop planning for gastrostomy placement EXAM: CT ABDOMEN WITHOUT CONTRAST TECHNIQUE: Multidetector CT imaging of the abdomen was performed following the standard protocol without IV contrast. COMPARISON:  None. FINDINGS: Lower chest: Small scattered nodular subpleural opacities in the posterior left lower lobe likely infectious/inflammatory. No pleural or pericardial effusion. Hepatobiliary: No focal liver abnormality is seen. No gallstones, gallbladder wall thickening, or biliary dilatation. Pancreas: Unremarkable. No pancreatic ductal dilatation or surrounding inflammatory changes. Spleen: Normal in size without focal abnormality. Adrenals/Urinary Tract: Bilateral adrenal hypertrophy. Bilateral nephrolithiasis, largest stone or cluster 6 mm in the mid right renal collecting system. No hydronephrosis or proximal ureterectasis. No focal renal  lesion identified. Stomach/Bowel: Nasogastric tube partially decompresses the stomach. There is a safe percutaneous window evident for gastrostomy placement. Visualized portions of small bowel and colon are nondilated. Vascular/Lymphatic: Minimal aortic calcifications without aneurysm. No adenopathy localized. Other: No ascites.  No free air. Musculoskeletal: Healing left 10,11,12th rib fractures. Healing L2 and L3 left transverse process fractures. compression fracture deformity of L1 with estimated 25% loss of height anteriorly, no retropulsion or posterior element involvement. Degenerative disc disease L4-5, L5-S1. IMPRESSION: 1. There exists a safe percutaneous window for percutaneous gastrostomy placement. 2. Bilateral nephrolithiasis without hydronephrosis. 3. Scattered nodular airspace opacities in the visualized left lung base, favor infectious/inflammatory etiology. 4. L1 compression fracture deformity, age indeterminate. 5. Healing left rib and lumbar transverse process fractures Electronically Signed   By: Corlis Leak M.D.   On: 12/03/2017 12:59   Dg Chest Port 1 View  Result Date: 12/06/2017 CLINICAL DATA:  Respiratory failure EXAM: PORTABLE CHEST 1 VIEW COMPARISON:  11/27/2017 FINDINGS: Cardiac shadow is within normal limits. Tracheostomy tube and nasogastric catheter are noted in satisfactory position. The lungs are hyperinflated. No focal infiltrate or sizable effusion is seen. IMPRESSION: COPD without acute abnormality Electronically Signed   By: Alcide Clever M.D.   On: 12/06/2017 08:14   Dg Chest Port 1 View  Result Date: 11/27/2017 CLINICAL DATA:  Respiratory failure. EXAM: PORTABLE CHEST 1 VIEW COMPARISON:  11/22/2016 FINDINGS: Tracheostomy tube in adequate position. Enteric tube has tip over the stomach in the left upper quadrant. Lungs are adequately inflated without focal airspace consolidation or effusion. Cardiomediastinal silhouette and remainder of the exam is unchanged. IMPRESSION:  No acute cardiopulmonary disease. Tubes and lines as described. Electronically Signed   By: Elberta Fortis M.D.   On: 11/27/2017 15:40   Dg Chest Port 1 View  Result Date: 11/22/2017 CLINICAL DATA:  Respiratory failure. EXAM: PORTABLE CHEST 1 VIEW COMPARISON:  Radiographs of November 15, 2017. FINDINGS: The heart size and mediastinal contours are within normal limits. No pneumothorax or pleural effusion is noted. Tracheostomy tube and nasogastric tube are unchanged in position. Right internal jugular catheter noted on prior exam has been removed. Both lungs are clear. The visualized skeletal structures are unremarkable. IMPRESSION: Stable position of tracheostomy and nasogastric tubes. No acute cardiopulmonary abnormality seen. Electronically Signed   By: Lupita Raider, M.D.   On: 11/22/2017 08:28   Dg Chest Port 1 View  Result Date: 11/15/2017 CLINICAL DATA:  62 year old male with respiratory failure, currently ventilated EXAM: PORTABLE CHEST 1 VIEW COMPARISON:  None. FINDINGS: Tracheostomy tube present. The tip is midline and at the level of the clavicles. A right IJ central venous catheter is present. The catheter tip overlies the cavoatrial junction. A nasogastric tube is present, the tip overlies the gastric fundus. Cardiac and mediastinal contours are within normal limits. Atherosclerotic calcifications present in the transverse aorta. Bilateral interstitial prominence and diffuse bronchitic changes. The lungs are also hyperinflated. No pneumothorax, pulmonary edema or large effusion. Nonspecific retrocardiac opacity may reflect atelectasis or infiltrate. IMPRESSION:  1. Satisfactory position of support apparatus. 2. Hyperinflation, diffuse bronchitic and interstitial changes favored to represent sequelae of COPD. 3. Nonspecific left retrocardiac opacity may reflect atelectasis, scarring or infiltrate. 4.  Aortic Atherosclerosis (ICD10-170.0) Electronically Signed   By: Malachy Moan M.D.   On:  11/15/2017 14:11   Dg Abd Portable 1v  Result Date: 11/16/2017 CLINICAL DATA:  NG tube placement EXAM: PORTABLE ABDOMEN - 1 VIEW COMPARISON:  11/16/2017 FINDINGS: Esophageal tube tip overlies the gastric fundus region. Elevation of the left diaphragm. Mild gaseous enlargement of left upper quadrant bowel. IMPRESSION: Esophageal tube tip overlies the proximal stomach Electronically Signed   By: Jasmine Pang M.D.   On: 11/16/2017 23:57   Dg Abd Portable 1v  Result Date: 11/16/2017 CLINICAL DATA:  62 year old male with history of nasogastric tube placement. EXAM: PORTABLE ABDOMEN - 1 VIEW COMPARISON:  Abdominal radiograph 11/16/2017. FINDINGS: Previously noted nasogastric tube is no longer confidently identified. Abdomen is incompletely imaged, but there is a nonobstructive bowel gas pattern. IMPRESSION: 1. Nasogastric tube not visualized, presumably withdrawn or potentially coiled in the oropharynx. Electronically Signed   By: Trudie Reed M.D.   On: 11/16/2017 19:54   Dg Abd Portable 1v  Result Date: 11/16/2017 CLINICAL DATA:  62 year old male. Nasogastric tube placement. Subsequent encounter. EXAM: PORTABLE ABDOMEN - 1 VIEW COMPARISON:  11/15/2017. FINDINGS: Nasogastric tube side hole just beyond the gastroesophageal junction level. Tip gastric fundus-body junction level. Gas-filled bowel right aspect of the abdomen. IMPRESSION: Nasogastric tube tip gastric fundus-body junction level. Nasogastric tube side hole just beyond the gastroesophageal junction level. Electronically Signed   By: Lacy Duverney M.D.   On: 11/16/2017 15:01   Dg Abd Portable 1v  Result Date: 11/15/2017 CLINICAL DATA:  NG tube placement EXAM: PORTABLE ABDOMEN - 1 VIEW COMPARISON:  None. FINDINGS: Monitor leads overlie the chest and abdomen. NG tube extends to the proximal stomach beneath the left hemidiaphragm. Central line tip at the [facility] RA junction, partially imaged. Nonspecific bowel gas pattern. IMPRESSION: NG tube proximal  stomach. Electronically Signed   By: Judie Petit.  Shick M.D.   On: 11/15/2017 14:08    Labs:  CBC: Recent Labs    11/22/17 0726 11/27/17 0650 11/28/17 0657 12/06/17 0720  WBC 8.4 26.8* 18.7* 15.1*  HGB 9.4* 10.0* 9.1* 9.3*  HCT 30.3* 31.6* 29.7* 29.9*  PLT 425* 554* 424* 289    COAGS: Recent Labs    11/16/17 0638 12/06/17 0720  INR 1.24 1.10    BMP: Recent Labs    11/16/17 0638 11/17/17 0525 11/22/17 0726 11/27/17 0650 12/06/17 0720  NA 139  --  140 136 142  K 3.4* 3.3* 3.6 4.2 4.2  CL 99  --  98 92* 96*  CO2 30  --  33* 34* 39*  GLUCOSE 111*  --  96 186* 85  BUN 14  --  14 25* 24*  CALCIUM 8.8*  --  9.1 9.4 9.8  CREATININE 0.40*  --  0.40* 0.51* 0.49*  GFRNONAA >60  --  >60 >60 >60  GFRAA >60  --  >60 >60 >60    LIVER FUNCTION TESTS: Recent Labs    11/16/17 0638  BILITOT 0.8  AST 18  ALT 31  ALKPHOS 97  PROT 5.4*  ALBUMIN 2.6*    TUMOR MARKERS: No results for input(s): AFPTM, CEA, CA199, CHROMGRNA in the last 8760 hours.  Assessment and Plan:  COPD; resp failure Encephalopathy CVA Dysphagia; malnutrition Long term care Scheduled for percutaneous gastric tube in  am (need consent with wife-- unable to reach her as of yet)    Thank you for this interesting consult.  I greatly enjoyed meeting Hamzeh Tall and look forward to participating in their care.  A copy of this report was sent to the requesting provider on this date.  Electronically Signed: Robet Leu, PA-C 12/07/2017, 11:27 AM   I spent a total of 40 Minutes    in face to face in clinical consultation, greater than 50% of which was counseling/coordinating care for perc G tube placement

## 2017-12-07 NOTE — Progress Notes (Signed)
[facility]   PULMONARY CRITICAL CARE SERVICE  PROGRESS NOTE  Date of Service: 12/07/2017  Arther DamesDavid Diedrich  ZOX:[id number]  DOB: December 24, 1955   DOA: 11/15/2017  Referring Physician: Carron CurieAli Hijazi, MD  HPI: Arther DamesDavid Granda is a 62 y.o. male seen for follow up of Acute on Chronic Respiratory Failure.  Patient remains on T collar tolerating it fairly well right now is on 30% oxygen good saturations are noted.  Medications: Reviewed on Rounds  Physical Exam:  Vitals: Temperature 97.0 pulse 99 respiratory rate 28 blood pressure 126/68 saturations 100%  Ventilator Settings off the ventilator on T collar  . General: Comfortable at this time . Eyes: Grossly normal lids, irises & conjunctiva . ENT: grossly tongue is normal . Neck: no obvious mass . Cardiovascular: S1 S2 normal no gallop . Respiratory: Coarse breath sounds no rhonchi are noted . Abdomen: soft . Skin: no rash seen on limited exam . Musculoskeletal: not rigid . Psychiatric:unable to assess . Neurologic: no seizure no involuntary movements         Lab Data:   Basic Metabolic Panel: Recent Labs  Lab 12/06/17 0720  NA 142  K 4.2  CL 96*  CO2 39*  GLUCOSE 85  BUN 24*  CREATININE 0.49*  CALCIUM 9.8    ABG: No results for input(s): PHART, PCO2ART, PO2ART, HCO3, O2SAT in the last 168 hours.  Liver Function Tests: No results for input(s): AST, ALT, ALKPHOS, BILITOT, PROT, ALBUMIN in the last 168 hours. No results for input(s): LIPASE, AMYLASE in the last 168 hours. No results for input(s): AMMONIA in the last 168 hours.  CBC: Recent Labs  Lab 12/06/17 0720  WBC 15.1*  HGB 9.3*  HCT 29.9*  MCV 105.7*  PLT 289    Cardiac Enzymes: No results for input(s): CKTOTAL, CKMB, CKMBINDEX, TROPONINI in the last 168 hours.  BNP (last 3 results) No results for input(s): BNP in the last 8760 hours.  ProBNP (last 3 results) No results for input(s): PROBNP in the last  8760 hours.  Radiological Exams: Dg Chest Port 1 View  Result Date: 12/06/2017 CLINICAL DATA:  Respiratory failure EXAM: PORTABLE CHEST 1 VIEW COMPARISON:  11/27/2017 FINDINGS: Cardiac shadow is within normal limits. Tracheostomy tube and nasogastric catheter are noted in satisfactory position. The lungs are hyperinflated. No focal infiltrate or sizable effusion is seen. IMPRESSION: COPD without acute abnormality Electronically Signed   By: Alcide CleverMark  Lukens M.D.   On: 12/06/2017 08:14    Assessment/Plan Principal Problem:   Acute on chronic respiratory failure with hypoxia (HCC) Active Problems:   Severe sepsis with septic shock (CODE) (HCC)   Chronic atrial fibrillation (HCC)   Metabolic encephalopathy   COPD, severe (HCC)   Aspiration pneumonia due to gastric secretions (HCC)   Delirium due to another medical condition   1. Acute on chronic respiratory failure with hypoxia patient is on weaning with the T collar currently is on 30% FiO2 should be able to wean down to 28% 2. Severe sepsis hemodynamically stable resolved. 3. Chronic atrial fibrillation rate is controlled 4. Severe COPD at baseline continue present management 5. Pneumonia due to aspiration treated we will continue to follow 6. Encephalopathy grossly unchanged   I have personally seen and evaluated the patient, evaluated laboratory and imaging results, formulated the assessment and plan and placed orders. The Patient requires high complexity decision making for assessment and support.  Case was discussed on Rounds with the Respiratory Therapy Staff  Saadat A  Humphrey Rolls, MD [facility] Pulmonary Critical Care Medicine Sleep Medicine

## 2017-12-08 DIAGNOSIS — J9621 Acute and chronic respiratory failure with hypoxia: Secondary | ICD-10-CM | POA: Diagnosis not present

## 2017-12-08 DIAGNOSIS — I482 Chronic atrial fibrillation: Secondary | ICD-10-CM | POA: Diagnosis not present

## 2017-12-08 DIAGNOSIS — J449 Chronic obstructive pulmonary disease, unspecified: Secondary | ICD-10-CM | POA: Diagnosis not present

## 2017-12-08 DIAGNOSIS — J690 Pneumonitis due to inhalation of food and vomit: Secondary | ICD-10-CM | POA: Diagnosis not present

## 2017-12-08 LAB — CBC
HCT: 30.6 % — ABNORMAL LOW (ref 39.0–52.0)
HEMOGLOBIN: 9.4 g/dL — AB (ref 13.0–17.0)
MCH: 32.6 pg (ref 26.0–34.0)
MCHC: 30.7 g/dL (ref 30.0–36.0)
MCV: 106.3 fL — ABNORMAL HIGH (ref 78.0–100.0)
PLATELETS: 298 10*3/uL (ref 150–400)
RBC: 2.88 MIL/uL — AB (ref 4.22–5.81)
RDW: 15 % (ref 11.5–15.5)
WBC: 17.5 10*3/uL — ABNORMAL HIGH (ref 4.0–10.5)

## 2017-12-08 NOTE — Progress Notes (Signed)
[facility] [place]   PULMONARY CRITICAL CARE SERVICE  PROGRESS NOTE  Date of Service: 12/08/2017  Daniel DamesDavid Irwin  JYN:[id number]  DOB: 1955/05/19   DOA: 11/15/2017  Referring Physician: Carron CurieAli Hijazi, MD  HPI: Daniel Irwin is a 62 y.o. male seen for follow up of Acute on Chronic Respiratory Failure.  Patient is on T collar doing fairly well.  He is still periodically agitated  Medications: Reviewed on Rounds  Physical Exam:  Vitals: Temperature 97.6 pulse 70 respiratory rate 22 blood pressure 110/52 saturations are 98%  Ventilator Settings currently off the ventilator on T collar FiO2 28%  . General: Comfortable at this time . Eyes: Grossly normal lids, irises & conjunctiva . ENT: grossly tongue is normal . Neck: no obvious mass . Cardiovascular: S1 S2 normal no gallop . Respiratory: Coarse breath sounds no rhonchi or rales are noted . Abdomen: soft . Skin: no rash seen on limited exam . Musculoskeletal: not rigid . Psychiatric:unable to assess . Neurologic: no seizure no involuntary movements         Lab Data:   Basic Metabolic Panel: Recent Labs  Lab 12/06/17 0720  NA 142  K 4.2  CL 96*  CO2 39*  GLUCOSE 85  BUN 24*  CREATININE 0.49*  CALCIUM 9.8    ABG: No results for input(s): PHART, PCO2ART, PO2ART, HCO3, O2SAT in the last 168 hours.  Liver Function Tests: No results for input(s): AST, ALT, ALKPHOS, BILITOT, PROT, ALBUMIN in the last 168 hours. No results for input(s): LIPASE, AMYLASE in the last 168 hours. No results for input(s): AMMONIA in the last 168 hours.  CBC: Recent Labs  Lab 12/06/17 0720 12/08/17 0544  WBC 15.1* 17.5*  HGB 9.3* 9.4*  HCT 29.9* 30.6*  MCV 105.7* 106.3*  PLT 289 298    Cardiac Enzymes: No results for input(s): CKTOTAL, CKMB, CKMBINDEX, TROPONINI in the last 168 hours.  BNP (last 3 results) No results for input(s): BNP in the last 8760 hours.  ProBNP (last 3 results) No  results for input(s): PROBNP in the last 8760 hours.  Radiological Exams: No results found.  Assessment/Plan Principal Problem:   Acute on chronic respiratory failure with hypoxia (HCC) Active Problems:   Severe sepsis with septic shock (CODE) (HCC)   Chronic atrial fibrillation (HCC)   Metabolic encephalopathy   COPD, severe (HCC)   Aspiration pneumonia due to gastric secretions (HCC)   Delirium due to another medical condition   1. Acute on chronic respiratory failure with hypoxia we will continue with the T collar weaning as tolerated titrate oxygen continue pulmonary toilet 2. Severe sepsis with shock hemodynamically stable we will continue with present therapy. 3. Chronic atrial fibrillation rate is controlled we will continue with supportive care 4. Metabolic encephalopathy still confused 5. Severe COPD at baseline continue present management 6. Pneumonia due to aspiration treated we will follow 7. Delirium as mentioned still very confused   I have personally seen and evaluated the patient, evaluated laboratory and imaging results, formulated the assessment and plan and placed orders. The Patient requires high complexity decision making for assessment and support.  Case was discussed on Rounds with the Respiratory Therapy Staff  Yevonne PaxSaadat A Waino Mounsey, MD [facility] Pulmonary Critical Care Medicine Sleep Medicine

## 2017-12-09 DIAGNOSIS — I482 Chronic atrial fibrillation: Secondary | ICD-10-CM | POA: Diagnosis not present

## 2017-12-09 DIAGNOSIS — J449 Chronic obstructive pulmonary disease, unspecified: Secondary | ICD-10-CM | POA: Diagnosis not present

## 2017-12-09 DIAGNOSIS — J9621 Acute and chronic respiratory failure with hypoxia: Secondary | ICD-10-CM | POA: Diagnosis not present

## 2017-12-09 DIAGNOSIS — J690 Pneumonitis due to inhalation of food and vomit: Secondary | ICD-10-CM | POA: Diagnosis not present

## 2017-12-09 LAB — URINALYSIS, ROUTINE W REFLEX MICROSCOPIC
Bilirubin Urine: NEGATIVE
GLUCOSE, UA: NEGATIVE mg/dL
HGB URINE DIPSTICK: NEGATIVE
Ketones, ur: NEGATIVE mg/dL
NITRITE: NEGATIVE
Protein, ur: NEGATIVE mg/dL
SPECIFIC GRAVITY, URINE: 1.016 (ref 1.005–1.030)
WBC, UA: >50 WBC/hpf — ABNORMAL HIGH (ref 0–5)
pH: 6 (ref 5.0–8.0)

## 2017-12-09 LAB — CBC
HEMATOCRIT: 30.3 % — AB (ref 39.0–52.0)
HEMOGLOBIN: 9.4 g/dL — AB (ref 13.0–17.0)
MCH: 32.9 pg (ref 26.0–34.0)
MCHC: 31 g/dL (ref 30.0–36.0)
MCV: 105.9 fL — AB (ref 78.0–100.0)
Platelets: 290 10*3/uL (ref 150–400)
RBC: 2.86 MIL/uL — ABNORMAL LOW (ref 4.22–5.81)
RDW: 15.1 % (ref 11.5–15.5)
WBC: 17.9 10*3/uL — ABNORMAL HIGH (ref 4.0–10.5)

## 2017-12-09 NOTE — Progress Notes (Signed)
Called to floor RN to notify that due to time constraints we will not be able to get to this pt procedure today. She verbalized understanding. Advised that we will attempt to send for pt tomorrow given IR schedule allows.

## 2017-12-09 NOTE — Progress Notes (Signed)
Pulmonary Critical Care Medicine [facility] [place]   PULMONARY CRITICAL CARE SERVICE  PROGRESS NOTE  Date of Service: 12/09/2017  Daniel Irwin  ZOX:[id number]  DOB: 15-Mar-1956   DOA: 11/15/2017  Referring Physician: Carron Curie, MD  HPI: Daniel Irwin is a 62 y.o. male seen for follow up of Acute on Chronic Respiratory Failure.  Resting comfortably on T collar no distress is noted at this time.  Patient's been off the ventilator for more than 48 hours  Medications: Reviewed on Rounds  Physical Exam:  Vitals: Temperature 97.7 pulse 85 respiratory 28 blood pressure 118/62 saturation 94%  Ventilator Settings off the ventilator on T collar  . General: Comfortable at this time . Eyes: Grossly normal lids, irises & conjunctiva . ENT: grossly tongue is normal . Neck: no obvious mass . Cardiovascular: S1 S2 normal no gallop . Respiratory: No rhonchi no rales . Abdomen: soft . Skin: no rash seen on limited exam . Musculoskeletal: not rigid . Psychiatric:unable to assess . Neurologic: no seizure no involuntary movements         Lab Data:   Basic Metabolic Panel: Recent Labs  Lab 12/06/17 0720  NA 142  K 4.2  CL 96*  CO2 39*  GLUCOSE 85  BUN 24*  CREATININE 0.49*  CALCIUM 9.8    ABG: No results for input(s): PHART, PCO2ART, PO2ART, HCO3, O2SAT in the last 168 hours.  Liver Function Tests: No results for input(s): AST, ALT, ALKPHOS, BILITOT, PROT, ALBUMIN in the last 168 hours. No results for input(s): LIPASE, AMYLASE in the last 168 hours. No results for input(s): AMMONIA in the last 168 hours.  CBC: Recent Labs  Lab 12/06/17 0720 12/08/17 0544 12/09/17 0435  WBC 15.1* 17.5* 17.9*  HGB 9.3* 9.4* 9.4*  HCT 29.9* 30.6* 30.3*  MCV 105.7* 106.3* 105.9*  PLT 289 298 290    Cardiac Enzymes: No results for input(s): CKTOTAL, CKMB, CKMBINDEX, TROPONINI in the last 168 hours.  BNP (last 3 results) No results for input(s): BNP in the last 8760  hours.  ProBNP (last 3 results) No results for input(s): PROBNP in the last 8760 hours.  Radiological Exams: No results found.  Assessment/Plan Principal Problem:   Acute on chronic respiratory failure with hypoxia (HCC) Active Problems:   Severe sepsis with septic shock (CODE) (HCC)   Chronic atrial fibrillation (HCC)   Metabolic encephalopathy   COPD, severe (HCC)   Aspiration pneumonia due to gastric secretions (HCC)   Delirium due to another medical condition   1. Acute on chronic respiratory failure with hypoxia continue to wean hopefully we should be able to change his trach out soon 2. Metabolic encephalopathy remains about the same continue with supportive care 3. Severe COPD at baseline continue pulmonary toilet 4. Pneumonia due to aspiration treated 5. Delirium about the same we will continue with supportive care   I have personally seen and evaluated the patient, evaluated laboratory and imaging results, formulated the assessment and plan and placed orders. The Patient requires high complexity decision making for assessment and support.  Case was discussed on Rounds with the Respiratory Therapy Staff  Yevonne Pax, MD [facility] Pulmonary Critical Care Medicine Sleep Medicine

## 2017-12-10 DIAGNOSIS — J449 Chronic obstructive pulmonary disease, unspecified: Secondary | ICD-10-CM | POA: Diagnosis not present

## 2017-12-10 DIAGNOSIS — J9621 Acute and chronic respiratory failure with hypoxia: Secondary | ICD-10-CM | POA: Diagnosis not present

## 2017-12-10 DIAGNOSIS — I482 Chronic atrial fibrillation: Secondary | ICD-10-CM | POA: Diagnosis not present

## 2017-12-10 DIAGNOSIS — J690 Pneumonitis due to inhalation of food and vomit: Secondary | ICD-10-CM | POA: Diagnosis not present

## 2017-12-10 LAB — URINE CULTURE: Culture: 40000 — AB

## 2017-12-10 NOTE — Progress Notes (Signed)
Pulmonary Critical Care Medicine [facility] [place]   PULMONARY CRITICAL CARE SERVICE  PROGRESS NOTE  Date of Service: 12/10/2017  Daniel Irwin  ZOX:[id number]  DOB: [birth year]   DOA: 11/15/2017  Referring Physician: Carron Curie, MD  HPI: Daniel Irwin is a 62 y.o. male seen for follow up of Acute on Chronic Respiratory Failure.  Patient is on T collar right now has been doing well with the wean.  Currently is on 28% oxygen  Medications: Reviewed on Rounds  Physical Exam:  Vitals: Temperature 97.9 pulse 73 respiratory rate 33 blood pressure 104/52 saturations 95%  Ventilator Settings off the ventilator on T collar  . General: Comfortable at this time . Eyes: Grossly normal lids, irises & conjunctiva . ENT: grossly tongue is normal . Neck: no obvious mass . Cardiovascular: S1 S2 normal no gallop . Respiratory: No rhonchi or rales are noted . Abdomen: soft . Skin: no rash seen on limited exam . Musculoskeletal: not rigid . Psychiatric:unable to assess . Neurologic: no seizure no involuntary movements         Lab Data:   Basic Metabolic Panel: Recent Labs  Lab 12/06/17 0720  NA 142  K 4.2  CL 96*  CO2 39*  GLUCOSE 85  BUN 24*  CREATININE 0.49*  CALCIUM 9.8    ABG: No results for input(s): PHART, PCO2ART, PO2ART, HCO3, O2SAT in the last 168 hours.  Liver Function Tests: No results for input(s): AST, ALT, ALKPHOS, BILITOT, PROT, ALBUMIN in the last 168 hours. No results for input(s): LIPASE, AMYLASE in the last 168 hours. No results for input(s): AMMONIA in the last 168 hours.  CBC: Recent Labs  Lab 12/06/17 0720 12/08/17 0544 12/09/17 0435  WBC 15.1* 17.5* 17.9*  HGB 9.3* 9.4* 9.4*  HCT 29.9* 30.6* 30.3*  MCV 105.7* 106.3* 105.9*  PLT 289 298 290    Cardiac Enzymes: No results for input(s): CKTOTAL, CKMB, CKMBINDEX, TROPONINI in the last 168 hours.  BNP (last 3 results) No results for input(s): BNP in the last 8760 hours.  ProBNP  (last 3 results) No results for input(s): PROBNP in the last 8760 hours.  Radiological Exams: No results found.  Assessment/Plan Principal Problem:   Acute on chronic respiratory failure with hypoxia (HCC) Active Problems:   Severe sepsis with septic shock (CODE) (HCC)   Chronic atrial fibrillation (HCC)   Metabolic encephalopathy   COPD, severe (HCC)   Aspiration pneumonia due to gastric secretions (HCC)   Delirium due to another medical condition   1. Acute on chronic respiratory failure with hypoxia we will continue weaning on T collar continue pulmonary toilet supportive care. 2. Metabolic encephalopathy grossly unchanged we will continue to follow 3. Severe COPD at baseline continue present management 4. Pneumonia due to aspiration will continue present therapy 5. Delirium at baseline   I have personally seen and evaluated the patient, evaluated laboratory and imaging results, formulated the assessment and plan and placed orders. The Patient requires high complexity decision making for assessment and support.  Case was discussed on Rounds with the Respiratory Therapy Staff  Yevonne Pax, MD [facility] Pulmonary Critical Care Medicine Sleep Medicine

## 2017-12-11 DIAGNOSIS — J690 Pneumonitis due to inhalation of food and vomit: Secondary | ICD-10-CM | POA: Diagnosis not present

## 2017-12-11 DIAGNOSIS — I482 Chronic atrial fibrillation: Secondary | ICD-10-CM | POA: Diagnosis not present

## 2017-12-11 DIAGNOSIS — J449 Chronic obstructive pulmonary disease, unspecified: Secondary | ICD-10-CM | POA: Diagnosis not present

## 2017-12-11 DIAGNOSIS — J9621 Acute and chronic respiratory failure with hypoxia: Secondary | ICD-10-CM | POA: Diagnosis not present

## 2017-12-11 NOTE — Progress Notes (Signed)
Pulmonary Critical Care Medicine [facility] [place]   PULMONARY CRITICAL CARE SERVICE  PROGRESS NOTE  Date of Service: 12/11/2017  Daniel Irwin  ZOX:[id number]  DOB: [birth year]   DOA: 11/15/2017  Referring Physician: Carron Curie, MD  HPI: Daniel Irwin is a 62 y.o. male seen for follow up of Acute on Chronic Respiratory Failure.  Currently is on T collar has been on 35% oxygen.  Doing fairly well should be able to deflate the cuff and change to a cuffless trach  Medications: Reviewed on Rounds  Physical Exam:  Vitals: Temperature 97.3 pulse 78 respiratory rate 23 blood pressure 105/48 saturation 95%  Ventilator Settings off the ventilator on T collar  . General: Comfortable at this time . Eyes: Grossly normal lids, irises & conjunctiva . ENT: grossly tongue is normal . Neck: no obvious mass . Cardiovascular: S1 S2 normal no gallop . Respiratory: No rhonchi or rales are noted at this time . Abdomen: soft . Skin: no rash seen on limited exam . Musculoskeletal: not rigid . Psychiatric:unable to assess . Neurologic: no seizure no involuntary movements         Lab Data:   Basic Metabolic Panel: Recent Labs  Lab 12/06/17 0720  NA 142  K 4.2  CL 96*  CO2 39*  GLUCOSE 85  BUN 24*  CREATININE 0.49*  CALCIUM 9.8    ABG: No results for input(s): PHART, PCO2ART, PO2ART, HCO3, O2SAT in the last 168 hours.  Liver Function Tests: No results for input(s): AST, ALT, ALKPHOS, BILITOT, PROT, ALBUMIN in the last 168 hours. No results for input(s): LIPASE, AMYLASE in the last 168 hours. No results for input(s): AMMONIA in the last 168 hours.  CBC: Recent Labs  Lab 12/06/17 0720 12/08/17 0544 12/09/17 0435  WBC 15.1* 17.5* 17.9*  HGB 9.3* 9.4* 9.4*  HCT 29.9* 30.6* 30.3*  MCV 105.7* 106.3* 105.9*  PLT 289 298 290    Cardiac Enzymes: No results for input(s): CKTOTAL, CKMB, CKMBINDEX, TROPONINI in the last 168 hours.  BNP (last 3 results) No results for  input(s): BNP in the last 8760 hours.  ProBNP (last 3 results) No results for input(s): PROBNP in the last 8760 hours.  Radiological Exams: No results found.  Assessment/Plan Principal Problem:   Acute on chronic respiratory failure with hypoxia (HCC) Active Problems:   Severe sepsis with septic shock (CODE) (HCC)   Chronic atrial fibrillation (HCC)   Metabolic encephalopathy   COPD, severe (HCC)   Aspiration pneumonia due to gastric secretions (HCC)   Delirium due to another medical condition   1. Acute on chronic respiratory failure with hypoxia continue with the T collar trials as tolerated continue pulmonary toilet supportive care. 2. Severe sepsis with shock hemodynamically stable we will continue with present management 3. Chronic atrial fibrillation rate is controlled we will follow 4. Metabolic encephalopathy at baseline we will continue present therapy 5. Severe COPD continue present management. 6. Aspiration pneumonia treated 7. Delirium stable we will monitor   I have personally seen and evaluated the patient, evaluated laboratory and imaging results, formulated the assessment and plan and placed orders. The Patient requires high complexity decision making for assessment and support.  Case was discussed on Rounds with the Respiratory Therapy Staff  Yevonne Pax, MD [facility] Pulmonary Critical Care Medicine Sleep Medicine

## 2017-12-12 DIAGNOSIS — J9621 Acute and chronic respiratory failure with hypoxia: Secondary | ICD-10-CM | POA: Diagnosis not present

## 2017-12-12 DIAGNOSIS — J690 Pneumonitis due to inhalation of food and vomit: Secondary | ICD-10-CM | POA: Diagnosis not present

## 2017-12-12 DIAGNOSIS — I482 Chronic atrial fibrillation: Secondary | ICD-10-CM | POA: Diagnosis not present

## 2017-12-12 DIAGNOSIS — J449 Chronic obstructive pulmonary disease, unspecified: Secondary | ICD-10-CM | POA: Diagnosis not present

## 2017-12-12 LAB — BLOOD GAS, ARTERIAL
Acid-Base Excess: 17 mmol/L — ABNORMAL HIGH (ref 0.0–2.0)
BICARBONATE: 42.7 mmol/L — AB (ref 20.0–28.0)
FIO2: 0.35
O2 Saturation: 86.2 %
PCO2 ART: 69.6 mmHg — AB (ref 32.0–48.0)
PH ART: 7.405 (ref 7.350–7.450)
PO2 ART: 51.6 mmHg — AB (ref 83.0–108.0)
Patient temperature: 98.6

## 2017-12-12 LAB — CULTURE, RESPIRATORY W GRAM STAIN

## 2017-12-12 LAB — CULTURE, RESPIRATORY: CULTURE: NORMAL

## 2017-12-12 NOTE — Progress Notes (Signed)
Pulmonary Critical Care Medicine [facility] [place]   PULMONARY CRITICAL CARE SERVICE  PROGRESS NOTE  Date of Service: 12/12/2017  Daniel Irwin  ZOX:[id number]  DOB: Dec 28, 1955   DOA: 11/15/2017  Referring Physician: Carron Curie, MD  HPI: Daniel Irwin is a 62 y.o. male seen for follow up of Acute on Chronic Respiratory Failure.  Patient is on T collar side doing very well.  Cough is been deflated we should be able to hopefully changing over to a cuffless trach today  Medications: Reviewed on Rounds  Physical Exam:  Vitals: Temperature 97.0 pulse 80 respiratory rate 39 blood pressure 93/46 saturation 99%  Ventilator Settings off the ventilator on T collar FiO2 35%  . General: Comfortable at this time . Eyes: Grossly normal lids, irises & conjunctiva . ENT: grossly tongue is normal . Neck: no obvious mass . Cardiovascular: S1 S2 normal no gallop . Respiratory: No rhonchi no rales are noted at this time. . Abdomen: soft . Skin: no rash seen on limited exam . Musculoskeletal: not rigid . Psychiatric:unable to assess . Neurologic: no seizure no involuntary movements         Lab Data:   Basic Metabolic Panel: Recent Labs  Lab 12/06/17 0720  NA 142  K 4.2  CL 96*  CO2 39*  GLUCOSE 85  BUN 24*  CREATININE 0.49*  CALCIUM 9.8    ABG: No results for input(s): PHART, PCO2ART, PO2ART, HCO3, O2SAT in the last 168 hours.  Liver Function Tests: No results for input(s): AST, ALT, ALKPHOS, BILITOT, PROT, ALBUMIN in the last 168 hours. No results for input(s): LIPASE, AMYLASE in the last 168 hours. No results for input(s): AMMONIA in the last 168 hours.  CBC: Recent Labs  Lab 12/06/17 0720 12/08/17 0544 12/09/17 0435  WBC 15.1* 17.5* 17.9*  HGB 9.3* 9.4* 9.4*  HCT 29.9* 30.6* 30.3*  MCV 105.7* 106.3* 105.9*  PLT 289 298 290    Cardiac Enzymes: No results for input(s): CKTOTAL, CKMB, CKMBINDEX, TROPONINI in the last 168 hours.  BNP (last 3  results) No results for input(s): BNP in the last 8760 hours.  ProBNP (last 3 results) No results for input(s): PROBNP in the last 8760 hours.  Radiological Exams: No results found.  Assessment/Plan Principal Problem:   Acute on chronic respiratory failure with hypoxia (HCC) Active Problems:   Severe sepsis with septic shock (CODE) (HCC)   Chronic atrial fibrillation (HCC)   Metabolic encephalopathy   COPD, severe (HCC)   Aspiration pneumonia due to gastric secretions (HCC)   Delirium due to another medical condition   1. Acute on chronic respiratory failure with hypoxia we will continue weaning change the trach over to a cuffless trach. 2. Metabolic encephalopathy grossly unchanged 3. Severe COPD at baseline we will continue to monitor 4. Pneumonia due to aspiration treated continue monitoring 5. Delirium patient has waxing waning status mentally medications are being adjusted accordingly.   I have personally seen and evaluated the patient, evaluated laboratory and imaging results, formulated the assessment and plan and placed orders. The Patient requires high complexity decision making for assessment and support.  Case was discussed on Rounds with the Respiratory Therapy Staff  Yevonne Pax, MD [facility] Pulmonary Critical Care Medicine Sleep Medicine

## 2017-12-13 ENCOUNTER — Other Ambulatory Visit (HOSPITAL_COMMUNITY): Payer: Medicaid - Out of State

## 2017-12-13 DIAGNOSIS — J449 Chronic obstructive pulmonary disease, unspecified: Secondary | ICD-10-CM

## 2017-12-13 DIAGNOSIS — G9341 Metabolic encephalopathy: Secondary | ICD-10-CM

## 2017-12-13 DIAGNOSIS — R6521 Severe sepsis with septic shock: Secondary | ICD-10-CM

## 2017-12-13 DIAGNOSIS — J690 Pneumonitis due to inhalation of food and vomit: Secondary | ICD-10-CM | POA: Diagnosis not present

## 2017-12-13 DIAGNOSIS — I482 Chronic atrial fibrillation: Secondary | ICD-10-CM

## 2017-12-13 DIAGNOSIS — J9621 Acute and chronic respiratory failure with hypoxia: Secondary | ICD-10-CM | POA: Diagnosis not present

## 2017-12-13 LAB — CBC
HCT: 29.4 % — ABNORMAL LOW (ref 39.0–52.0)
Hemoglobin: 8.7 g/dL — ABNORMAL LOW (ref 13.0–17.0)
MCH: 32.5 pg (ref 26.0–34.0)
MCHC: 29.6 g/dL — AB (ref 30.0–36.0)
MCV: 109.7 fL — ABNORMAL HIGH (ref 78.0–100.0)
Platelets: 189 10*3/uL (ref 150–400)
RBC: 2.68 MIL/uL — ABNORMAL LOW (ref 4.22–5.81)
RDW: 16.6 % — AB (ref 11.5–15.5)
WBC: 12.1 10*3/uL — ABNORMAL HIGH (ref 4.0–10.5)

## 2017-12-13 NOTE — Progress Notes (Signed)
Pulmonary Critical Care Medicine [facility] [place]   PULMONARY CRITICAL CARE SERVICE  PROGRESS NOTE  Date of Service: 12/13/2017  Daniel Irwin  ZOX:[id number]  DOB: [birth year]   DOA: 11/15/2017  Referring Physician: Carron Curie, MD  HPI: Daniel Irwin is a 62 y.o. male seen for follow up of Acute on Chronic Respiratory Failure.  Patient is comfortable without distress remains on T collar right now is on 35% oxygen.  We will try to wean the oxygen down today and was also noted to have elevated PCO2  Medications: Reviewed on Rounds  Physical Exam:  Vitals: Temperature 97.5 pulse 58 respiratory rate 38 blood pressure 101/48 saturations 100%  Ventilator Settings currently off the ventilator  . General: Comfortable at this time . Eyes: Grossly normal lids, irises & conjunctiva . ENT: grossly tongue is normal . Neck: no obvious mass . Cardiovascular: S1 S2 normal no gallop . Respiratory: No rhonchi no rales . Abdomen: soft . Skin: no rash seen on limited exam . Musculoskeletal: not rigid . Psychiatric:unable to assess . Neurologic: no seizure no involuntary movements         Lab Data:   Basic Metabolic Panel: No results for input(s): NA, K, CL, CO2, GLUCOSE, BUN, CREATININE, CALCIUM, MG, PHOS in the last 168 hours.  ABG: Recent Labs  Lab 12/12/17 1904  PHART 7.405  PCO2ART 69.6*  PO2ART 51.6*  HCO3 42.7*  O2SAT 86.2    Liver Function Tests: No results for input(s): AST, ALT, ALKPHOS, BILITOT, PROT, ALBUMIN in the last 168 hours. No results for input(s): LIPASE, AMYLASE in the last 168 hours. No results for input(s): AMMONIA in the last 168 hours.  CBC: Recent Labs  Lab 12/08/17 0544 12/09/17 0435 12/13/17 0704  WBC 17.5* 17.9* 12.1*  HGB 9.4* 9.4* 8.7*  HCT 30.6* 30.3* 29.4*  MCV 106.3* 105.9* 109.7*  PLT 298 290 189    Cardiac Enzymes: No results for input(s): CKTOTAL, CKMB, CKMBINDEX, TROPONINI in the last 168 hours.  BNP (last 3  results) No results for input(s): BNP in the last 8760 hours.  ProBNP (last 3 results) No results for input(s): PROBNP in the last 8760 hours.  Radiological Exams: No results found.  Assessment/Plan Principal Problem:   Acute on chronic respiratory failure with hypoxia (HCC) Active Problems:   Severe sepsis with septic shock (CODE) (HCC)   Chronic atrial fibrillation (HCC)   Metabolic encephalopathy   COPD, severe (HCC)   Aspiration pneumonia due to gastric secretions (HCC)   Delirium due to another medical condition   1. Acute on chronic respiratory failure with hypoxia continue with T collar wean the FiO2 follow-up ABG as needed 2. Severe sepsis resolved hemodynamically stable 3. Chronic atrial fibrillation rate is controlled we will continue to follow 4. Metabolic encephalopathy unchanged 5. Severe COPD at baseline we will continue to monitor. 6. Pneumonia due to aspiration treated we will continue to follow   I have personally seen and evaluated the patient, evaluated laboratory and imaging results, formulated the assessment and plan and placed orders. The Patient requires high complexity decision making for assessment and support.  Case was discussed on Rounds with the Respiratory Therapy Staff  Yevonne Pax, MD [facility] Pulmonary Critical Care Medicine Sleep Medicine

## 2017-12-13 NOTE — Progress Notes (Signed)
Patient scheduled for g-tube placement in IR today, unfortunately patient has developed shingles which is a contraindication for g-tube placement. Discussed with RN Chanel that consult to IR has been discontinued and that ordering provider must re-consult IR once patient has stabilized if they wish to proceed with g-tube placement.  RN states she will relay information to ordering provider, no questions or concerns voiced at this time.  Villa Herb, PA-C 12/13/17 1537

## 2017-12-14 ENCOUNTER — Other Ambulatory Visit (HOSPITAL_COMMUNITY): Payer: Medicaid - Out of State

## 2017-12-14 DIAGNOSIS — J449 Chronic obstructive pulmonary disease, unspecified: Secondary | ICD-10-CM | POA: Diagnosis not present

## 2017-12-14 DIAGNOSIS — J9621 Acute and chronic respiratory failure with hypoxia: Secondary | ICD-10-CM

## 2017-12-14 DIAGNOSIS — J690 Pneumonitis due to inhalation of food and vomit: Secondary | ICD-10-CM

## 2017-12-14 DIAGNOSIS — G9341 Metabolic encephalopathy: Secondary | ICD-10-CM

## 2017-12-14 DIAGNOSIS — R6521 Severe sepsis with septic shock: Secondary | ICD-10-CM

## 2017-12-14 DIAGNOSIS — I482 Chronic atrial fibrillation, unspecified: Secondary | ICD-10-CM

## 2017-12-14 LAB — BLOOD GAS, ARTERIAL
Acid-Base Excess: 18.4 mmol/L — ABNORMAL HIGH (ref 0.0–2.0)
Bicarbonate: 44.1 mmol/L — ABNORMAL HIGH (ref 20.0–28.0)
FIO2: 40
MECHVT: 450 mL
O2 SAT: 98.2 %
PEEP: 5 cmH2O
PH ART: 7.428 (ref 7.350–7.450)
Patient temperature: 98.6
RATE: 15 resp/min
pCO2 arterial: 67.9 mmHg (ref 32.0–48.0)
pO2, Arterial: 105 mmHg (ref 83.0–108.0)

## 2017-12-14 NOTE — Progress Notes (Signed)
Pulmonary Critical Care Medicine [facility] [place]   PULMONARY CRITICAL CARE SERVICE  PROGRESS NOTE  Date of Service: 12/14/2017  Daniel Irwin  [id number]  DOB: Apr 25, 1955   DOA: 11/15/2017  Referring Physician: Carron Curie, MD  HPI: Daniel Irwin is a 62 y.o. male seen for follow up of Acute on Chronic Respiratory Failure.  Patient apparently had a rapid response today was placed back on the ventilator.  Not clear as to the reason for the rapid response but now is doing better  Medications: Reviewed on Rounds  Physical Exam:  Vitals: Temperature 97.6 pulse 78 respiratory rate 17 blood pressure 155/66 saturations 96%  Ventilator Settings mode of ventilation assist control FiO2 30% tidal volume 542 PEEP 5  . General: Comfortable at this time . Eyes: Grossly normal lids, irises & conjunctiva . ENT: grossly tongue is normal . Neck: no obvious mass . Cardiovascular: S1 S2 normal no gallop . Respiratory: Coarse breath sounds no rhonchi . Abdomen: soft . Skin: no rash seen on limited exam . Musculoskeletal: not rigid . Psychiatric:unable to assess . Neurologic: no seizure no involuntary movements         Lab Data:   Basic Metabolic Panel: No results for input(s): NA, K, CL, CO2, GLUCOSE, BUN, CREATININE, CALCIUM, MG, PHOS in the last 168 hours.  ABG: Recent Labs  Lab 12/12/17 1904 12/14/17 1000  PHART 7.405 7.428  PCO2ART 69.6* 67.9*  PO2ART 51.6* 105  HCO3 42.7* 44.1*  O2SAT 86.2 98.2    Liver Function Tests: No results for input(s): AST, ALT, ALKPHOS, BILITOT, PROT, ALBUMIN in the last 168 hours. No results for input(s): LIPASE, AMYLASE in the last 168 hours. No results for input(s): AMMONIA in the last 168 hours.  CBC: Recent Labs  Lab 12/08/17 0544 12/09/17 0435 12/13/17 0704  WBC 17.5* 17.9* 12.1*  HGB 9.4* 9.4* 8.7*  HCT 30.6* 30.3* 29.4*  MCV 106.3* 105.9* 109.7*  PLT 298 290 189    Cardiac Enzymes: No results for input(s):  CKTOTAL, CKMB, CKMBINDEX, TROPONINI in the last 168 hours.  BNP (last 3 results) No results for input(s): BNP in the last 8760 hours.  ProBNP (last 3 results) No results for input(s): PROBNP in the last 8760 hours.  Radiological Exams: No results found.  Assessment/Plan Principal Problem:   Acute on chronic respiratory failure with hypoxia (HCC) Active Problems:   Severe sepsis with septic shock (CODE) (HCC)   Chronic atrial fibrillation   Metabolic encephalopathy   COPD, severe (HCC)   Aspiration pneumonia due to gastric secretions (HCC)   Delirium due to another medical condition   1. Acute on chronic respiratory failure with hypoxia we will continue with assist control rate now.  Respiratory therapy is going to check the spontaneous breathing index and try to start weaning again 2. Severe sepsis with shock continue with supportive care hemodynamically stable 3. Chronic atrial fibrillation rate is controlled 4. Metabolic encephalopathy grossly unchanged 5. Severe COPD at baseline we will continue to follow 6. Pneumonia due to aspiration will follow x-rays aspiration precautions continue with supportive care   I have personally seen and evaluated the patient, evaluated laboratory and imaging results, formulated the assessment and plan and placed orders. The Patient requires high complexity decision making for assessment and support.  Case was discussed on Rounds with the Respiratory Therapy Staff  Yevonne Pax, MD [facility] Pulmonary Critical Care Medicine Sleep Medicine

## 2017-12-15 DIAGNOSIS — J690 Pneumonitis due to inhalation of food and vomit: Secondary | ICD-10-CM

## 2017-12-15 DIAGNOSIS — I482 Chronic atrial fibrillation, unspecified: Secondary | ICD-10-CM | POA: Diagnosis not present

## 2017-12-15 DIAGNOSIS — G9341 Metabolic encephalopathy: Secondary | ICD-10-CM

## 2017-12-15 DIAGNOSIS — J449 Chronic obstructive pulmonary disease, unspecified: Secondary | ICD-10-CM | POA: Diagnosis not present

## 2017-12-15 DIAGNOSIS — R6521 Severe sepsis with septic shock: Secondary | ICD-10-CM

## 2017-12-15 DIAGNOSIS — J9621 Acute and chronic respiratory failure with hypoxia: Secondary | ICD-10-CM | POA: Diagnosis not present

## 2017-12-15 LAB — URINE CULTURE: CULTURE: NO GROWTH

## 2017-12-15 LAB — BASIC METABOLIC PANEL
ANION GAP: 10 (ref 5–15)
BUN: 27 mg/dL — ABNORMAL HIGH (ref 8–23)
CALCIUM: 9.1 mg/dL (ref 8.9–10.3)
CO2: 35 mmol/L — ABNORMAL HIGH (ref 22–32)
Chloride: 92 mmol/L — ABNORMAL LOW (ref 98–111)
Creatinine, Ser: 0.5 mg/dL — ABNORMAL LOW (ref 0.61–1.24)
GFR calc Af Amer: >60 mL/min (ref >60)
GFR calc non Af Amer: >60 mL/min (ref >60)
GLUCOSE: 137 mg/dL — AB (ref 70–99)
POTASSIUM: 4 mmol/L (ref 3.5–5.1)
Sodium: 137 mmol/L (ref 135–145)

## 2017-12-15 NOTE — Progress Notes (Signed)
Pulmonary Critical Care Medicine [facility] [place]   PULMONARY CRITICAL CARE SERVICE  PROGRESS NOTE  Date of Service: 12/15/2017  Daniel Irwin  ZOX:[id number]  DOB: [birth year]   DOA: 11/15/2017  Referring Physician: Carron Curie, MD  HPI: Daniel Irwin is a 62 y.o. male seen for follow up of Acute on Chronic Respiratory Failure.  He is more awake remains on pressure support the goal is for 12-hour weaning looks good so far  Medications: Reviewed on Rounds  Physical Exam:  Vitals: Temperature 98.1 pulse 86 respiratory rate 26 blood pressure 101/64 saturation 96%  Ventilator Settings currently is on pressure support FiO2 40% tidal volume 437 pressure support 12 PEEP 5  . General: Comfortable at this time . Eyes: Grossly normal lids, irises & conjunctiva . ENT: grossly tongue is normal . Neck: no obvious mass . Cardiovascular: S1 S2 normal no gallop . Respiratory: No rhonchi or rales are noted at this time . Abdomen: soft . Skin: no rash seen on limited exam . Musculoskeletal: not rigid . Psychiatric:unable to assess . Neurologic: no seizure no involuntary movements         Lab Data:   Basic Metabolic Panel: Recent Labs  Lab 12/15/17 0539  NA 137  K 4.0  CL 92*  CO2 35*  GLUCOSE 137*  BUN 27*  CREATININE 0.50*  CALCIUM 9.1    ABG: Recent Labs  Lab 12/12/17 1904 12/14/17 1000  PHART 7.405 7.428  PCO2ART 69.6* 67.9*  PO2ART 51.6* 105  HCO3 42.7* 44.1*  O2SAT 86.2 98.2    Liver Function Tests: No results for input(s): AST, ALT, ALKPHOS, BILITOT, PROT, ALBUMIN in the last 168 hours. No results for input(s): LIPASE, AMYLASE in the last 168 hours. No results for input(s): AMMONIA in the last 168 hours.  CBC: Recent Labs  Lab 12/09/17 0435 12/13/17 0704  WBC 17.9* 12.1*  HGB 9.4* 8.7*  HCT 30.3* 29.4*  MCV 105.9* 109.7*  PLT 290 189    Cardiac Enzymes: No results for input(s): CKTOTAL, CKMB, CKMBINDEX, TROPONINI in the last 168  hours.  BNP (last 3 results) No results for input(s): BNP in the last 8760 hours.  ProBNP (last 3 results) No results for input(s): PROBNP in the last 8760 hours.  Radiological Exams: Dg Chest Port 1 View  Result Date: 12/14/2017 CLINICAL DATA:  Pneumonia. EXAM: PORTABLE CHEST 1 VIEW COMPARISON:  12/06/2017 and older exams. FINDINGS: Cardiac silhouette is normal in size. No mediastinal or hilar masses. No evidence of adenopathy. Lungs are hyperexpanded. Lungs are clear with no findings to indicate pneumonia. No pleural effusion or pneumothorax. Skeletal structures are demineralized but grossly intact. Tracheostomy tube and nasal/orogastric tube stable in well positioned. IMPRESSION: 1. No acute cardiopulmonary disease. 2. COPD. Electronically Signed   By: Amie Portland M.D.   On: 12/14/2017 14:41    Assessment/Plan Principal Problem:   Acute on chronic respiratory failure with hypoxia (HCC) Active Problems:   Severe sepsis with septic shock (CODE) (HCC)   Chronic atrial fibrillation   Metabolic encephalopathy   COPD, severe (HCC)   Aspiration pneumonia due to gastric secretions (HCC)   Delirium due to another medical condition   1. Acute on chronic respiratory failure with hypoxia patient is on pressure support right now 40% oxygen.  Good saturations are noted.  The goal is to wean for 12 hours 2. Severe sepsis hemodynamically stable continue present therapy. 3. COPD last chest x-ray shows no acute disease continue present therapy 4. Aspiration resolved 5. Delirium  stays about the same 6. Chronic atrial fibrillation rate is controlled   I have personally seen and evaluated the patient, evaluated laboratory and imaging results, formulated the assessment and plan and placed orders. The Patient requires high complexity decision making for assessment and support.  Case was discussed on Rounds with the Respiratory Therapy Staff  Yevonne Pax, MD [facility] Pulmonary Critical Care  Medicine Sleep Medicine

## 2017-12-16 DIAGNOSIS — J690 Pneumonitis due to inhalation of food and vomit: Secondary | ICD-10-CM | POA: Diagnosis not present

## 2017-12-16 DIAGNOSIS — J9621 Acute and chronic respiratory failure with hypoxia: Secondary | ICD-10-CM | POA: Diagnosis not present

## 2017-12-16 DIAGNOSIS — J449 Chronic obstructive pulmonary disease, unspecified: Secondary | ICD-10-CM | POA: Diagnosis not present

## 2017-12-16 DIAGNOSIS — I482 Chronic atrial fibrillation, unspecified: Secondary | ICD-10-CM | POA: Diagnosis not present

## 2017-12-16 NOTE — Progress Notes (Signed)
Pulmonary Critical Care Medicine [facility] [place]   PULMONARY CRITICAL CARE SERVICE  PROGRESS NOTE  Date of Service: 12/16/2017  Daniel Irwin  ZOX:[id number]  DOB: 11-Jun-1955   DOA: 11/15/2017  Referring Physician: Carron Curie, MD  HPI: Daniel Irwin is a 62 y.o. male seen for follow up of Acute on Chronic Respiratory Failure.  Patient is on pressure support mode at this time we should be able to advance to T collar and then advance to T collar as tolerated  Medications: Reviewed on Rounds  Physical Exam:  Vitals: Temperature 97.2 pulse 92 respiratory rate 22 blood pressure 110/68 saturations 98%  Ventilator Settings currently is on pressure support FiO2 30% tidal volume was 300 pressure support 12 PEEP 5  . General: Comfortable at this time . Eyes: Grossly normal lids, irises & conjunctiva . ENT: grossly tongue is normal . Neck: no obvious mass . Cardiovascular: S1 S2 normal no gallop . Respiratory: No rhonchi no rales are noted . Abdomen: soft . Skin: no rash seen on limited exam . Musculoskeletal: not rigid . Psychiatric:unable to assess . Neurologic: no seizure no involuntary movements         Lab Data:   Basic Metabolic Panel: Recent Labs  Lab 12/15/17 0539  NA 137  K 4.0  CL 92*  CO2 35*  GLUCOSE 137*  BUN 27*  CREATININE 0.50*  CALCIUM 9.1    ABG: Recent Labs  Lab 12/12/17 1904 12/14/17 1000  PHART 7.405 7.428  PCO2ART 69.6* 67.9*  PO2ART 51.6* 105  HCO3 42.7* 44.1*  O2SAT 86.2 98.2    Liver Function Tests: No results for input(s): AST, ALT, ALKPHOS, BILITOT, PROT, ALBUMIN in the last 168 hours. No results for input(s): LIPASE, AMYLASE in the last 168 hours. No results for input(s): AMMONIA in the last 168 hours.  CBC: Recent Labs  Lab 12/13/17 0704  WBC 12.1*  HGB 8.7*  HCT 29.4*  MCV 109.7*  PLT 189    Cardiac Enzymes: No results for input(s): CKTOTAL, CKMB, CKMBINDEX, TROPONINI in the last 168 hours.  BNP (last 3  results) No results for input(s): BNP in the last 8760 hours.  ProBNP (last 3 results) No results for input(s): PROBNP in the last 8760 hours.  Radiological Exams: No results found.  Assessment/Plan Principal Problem:   Acute on chronic respiratory failure with hypoxia (HCC) Active Problems:   Severe sepsis with septic shock (CODE) (HCC)   Chronic atrial fibrillation   Metabolic encephalopathy   COPD, severe (HCC)   Aspiration pneumonia due to gastric secretions (HCC)   Delirium due to another medical condition   1. Acute on chronic respiratory failure with hypoxia as noted above we will try to advance the T collar continue with supportive care 2. Chronic atrial fibrillation rate is controlled 3. Metabolic encephalopathy improving 4. Severe COPD aspiration treated we will monitor 5. Waxing and waning status we will continue to follow   I have personally seen and evaluated the patient, evaluated laboratory and imaging results, formulated the assessment and plan and placed orders. The Patient requires high complexity decision making for assessment and support.  Case was discussed on Rounds with the Respiratory Therapy Staff  Yevonne Pax, MD [facility] Pulmonary Critical Care Medicine Sleep Medicine

## 2017-12-17 DIAGNOSIS — J690 Pneumonitis due to inhalation of food and vomit: Secondary | ICD-10-CM | POA: Diagnosis not present

## 2017-12-17 DIAGNOSIS — J449 Chronic obstructive pulmonary disease, unspecified: Secondary | ICD-10-CM | POA: Diagnosis not present

## 2017-12-17 DIAGNOSIS — I482 Chronic atrial fibrillation, unspecified: Secondary | ICD-10-CM | POA: Diagnosis not present

## 2017-12-17 DIAGNOSIS — J9621 Acute and chronic respiratory failure with hypoxia: Secondary | ICD-10-CM | POA: Diagnosis not present

## 2017-12-17 NOTE — Progress Notes (Signed)
Pulmonary Critical Care Medicine [facility] [place]   PULMONARY CRITICAL CARE SERVICE  PROGRESS NOTE  Date of Service: 12/17/2017  Daniel Irwin  WUJ:[id number]  DOB: Jul 26, 1955   DOA: 11/15/2017  Referring Physician: Carron Curie, MD  HPI: Daniel Irwin is a 62 y.o. male seen for follow up of Acute on Chronic Respiratory Failure.  Patient is on pressure support wean we should be able to move to T collar again after rounds.  Right now is on 40% oxygen  Medications: Reviewed on Rounds  Physical Exam:  Vitals: Temperature 97.3 pulse 76 respiratory rate 30 blood pressure 108/52 saturations 98%  Ventilator Settings mode of ventilation pressure support FiO2 40% tidal volume 400 pressure support 12 PEEP 5  . General: Comfortable at this time . Eyes: Grossly normal lids, irises & conjunctiva . ENT: grossly tongue is normal . Neck: no obvious mass . Cardiovascular: S1 S2 normal no gallop . Respiratory: No rhonchi or rales are noted at this time coarse breath sounds . Abdomen: soft . Skin: no rash seen on limited exam . Musculoskeletal: not rigid . Psychiatric:unable to assess . Neurologic: no seizure no involuntary movements         Lab Data:   Basic Metabolic Panel: Recent Labs  Lab 12/15/17 0539  NA 137  K 4.0  CL 92*  CO2 35*  GLUCOSE 137*  BUN 27*  CREATININE 0.50*  CALCIUM 9.1    ABG: Recent Labs  Lab 12/12/17 1904 12/14/17 1000  PHART 7.405 7.428  PCO2ART 69.6* 67.9*  PO2ART 51.6* 105  HCO3 42.7* 44.1*  O2SAT 86.2 98.2    Liver Function Tests: No results for input(s): AST, ALT, ALKPHOS, BILITOT, PROT, ALBUMIN in the last 168 hours. No results for input(s): LIPASE, AMYLASE in the last 168 hours. No results for input(s): AMMONIA in the last 168 hours.  CBC: Recent Labs  Lab 12/13/17 0704  WBC 12.1*  HGB 8.7*  HCT 29.4*  MCV 109.7*  PLT 189    Cardiac Enzymes: No results for input(s): CKTOTAL, CKMB, CKMBINDEX, TROPONINI in the last  168 hours.  BNP (last 3 results) No results for input(s): BNP in the last 8760 hours.  ProBNP (last 3 results) No results for input(s): PROBNP in the last 8760 hours.  Radiological Exams: No results found.  Assessment/Plan Principal Problem:   Acute on chronic respiratory failure with hypoxia (HCC) Active Problems:   Severe sepsis with septic shock (CODE) (HCC)   Chronic atrial fibrillation   Metabolic encephalopathy   COPD, severe (HCC)   Aspiration pneumonia due to gastric secretions (HCC)   Delirium due to another medical condition   1. Acute on chronic respiratory failure with hypoxia we will continue with the pressure support wean patient right now is on 40% oxygen advance to T collar as discussed after rounds.  Continue with supportive care 2. Severe sepsis clinically resolved we will monitor 3. Chronic atrial fibrillation rate is controlled at this time 4. Metabolic encephalopathy patient is grossly unchanged 5. Severe COPD at baseline 6. Aspiration pneumonia treated monitor x-rays as necessary   I have personally seen and evaluated the patient, evaluated laboratory and imaging results, formulated the assessment and plan and placed orders. The Patient requires high complexity decision making for assessment and support.  Case was discussed on Rounds with the Respiratory Therapy Staff  Yevonne Pax, MD [facility] Pulmonary Critical Care Medicine Sleep Medicine

## 2017-12-18 DIAGNOSIS — J9621 Acute and chronic respiratory failure with hypoxia: Secondary | ICD-10-CM | POA: Diagnosis not present

## 2017-12-18 DIAGNOSIS — J449 Chronic obstructive pulmonary disease, unspecified: Secondary | ICD-10-CM | POA: Diagnosis not present

## 2017-12-18 DIAGNOSIS — J690 Pneumonitis due to inhalation of food and vomit: Secondary | ICD-10-CM | POA: Diagnosis not present

## 2017-12-18 DIAGNOSIS — I482 Chronic atrial fibrillation, unspecified: Secondary | ICD-10-CM | POA: Diagnosis not present

## 2017-12-18 NOTE — Progress Notes (Signed)
Pulmonary Critical Care Medicine [facility] [place]   PULMONARY CRITICAL CARE SERVICE  PROGRESS NOTE  Date of Service: 12/18/2017  Daniel Irwin  ZOX:[id number]  DOB: 1956-01-26   DOA: 11/15/2017  Referring Physician: Carron Curie, MD  HPI: Daniel Irwin is a 62 y.o. male seen for follow up of Acute on Chronic Respiratory Failure.  Patient right now is comfortable without distress has been on full vent support was placed back on the ventilator overnight should be able to start weaning again numbers are looking good  Medications: Reviewed on Rounds  Physical Exam:  Vitals: Temperature 96.4 pulse 63 respiratory rate 24 blood pressure 90/45 saturations 100%  Ventilator Settings mode of ventilation assist control FiO2 40% tidal volume 521 PEEP 5  . General: Comfortable at this time . Eyes: Grossly normal lids, irises & conjunctiva . ENT: grossly tongue is normal . Neck: no obvious mass . Cardiovascular: S1 S2 normal no gallop . Respiratory: No rhonchi no rales are noted at this time . Abdomen: soft . Skin: no rash seen on limited exam . Musculoskeletal: not rigid . Psychiatric:unable to assess . Neurologic: no seizure no involuntary movements         Lab Data:   Basic Metabolic Panel: Recent Labs  Lab 12/15/17 0539  NA 137  K 4.0  CL 92*  CO2 35*  GLUCOSE 137*  BUN 27*  CREATININE 0.50*  CALCIUM 9.1    ABG: Recent Labs  Lab 12/12/17 1904 12/14/17 1000  PHART 7.405 7.428  PCO2ART 69.6* 67.9*  PO2ART 51.6* 105  HCO3 42.7* 44.1*  O2SAT 86.2 98.2    Liver Function Tests: No results for input(s): AST, ALT, ALKPHOS, BILITOT, PROT, ALBUMIN in the last 168 hours. No results for input(s): LIPASE, AMYLASE in the last 168 hours. No results for input(s): AMMONIA in the last 168 hours.  CBC: Recent Labs  Lab 12/13/17 0704  WBC 12.1*  HGB 8.7*  HCT 29.4*  MCV 109.7*  PLT 189    Cardiac Enzymes: No results for input(s): CKTOTAL, CKMB, CKMBINDEX,  TROPONINI in the last 168 hours.  BNP (last 3 results) No results for input(s): BNP in the last 8760 hours.  ProBNP (last 3 results) No results for input(s): PROBNP in the last 8760 hours.  Radiological Exams: No results found.  Assessment/Plan Principal Problem:   Acute on chronic respiratory failure with hypoxia (HCC) Active Problems:   Severe sepsis with septic shock (CODE) (HCC)   Chronic atrial fibrillation   Metabolic encephalopathy   COPD, severe (HCC)   Aspiration pneumonia due to gastric secretions (HCC)   Delirium due to another medical condition   1. Acute on chronic respiratory failure with hypoxia we will continue with support right now on assist control mode we will going to try to resume his weaning back on T collar 2. Chronic atrial fibrillation rate is controlled we will follow 3. Metabolic encephalopathy grossly unchanged 4. Severe COPD at baseline 5. Aspiration pneumonia treated 6. Delirium unchanged   I have personally seen and evaluated the patient, evaluated laboratory and imaging results, formulated the assessment and plan and placed orders. The Patient requires high complexity decision making for assessment and support.  Case was discussed on Rounds with the Respiratory Therapy Staff  Yevonne Pax, MD [facility] Pulmonary Critical Care Medicine Sleep Medicine

## 2017-12-19 DIAGNOSIS — J449 Chronic obstructive pulmonary disease, unspecified: Secondary | ICD-10-CM | POA: Diagnosis not present

## 2017-12-19 DIAGNOSIS — J9621 Acute and chronic respiratory failure with hypoxia: Secondary | ICD-10-CM | POA: Diagnosis not present

## 2017-12-19 DIAGNOSIS — J690 Pneumonitis due to inhalation of food and vomit: Secondary | ICD-10-CM | POA: Diagnosis not present

## 2017-12-19 DIAGNOSIS — I482 Chronic atrial fibrillation, unspecified: Secondary | ICD-10-CM | POA: Diagnosis not present

## 2017-12-19 NOTE — Progress Notes (Signed)
Pulmonary Critical Care Medicine [facility] [place]   PULMONARY CRITICAL CARE SERVICE  PROGRESS NOTE  Date of Service: 12/19/2017  Daniel Irwin  ZOX:[id number]  DOB: Jul 19, 1955   DOA: 11/15/2017  Referring Physician: Carron Curie, MD  HPI: Daniel Irwin is a 62 y.o. male seen for follow up of Acute on Chronic Respiratory Failure.  Patient is on full vent support.  Should be able to start back on weaning on T collar today as discussed on rounds.  Patient right now is on assist control mode but he did well with the pressure support yesterday.  Today the goal will be to place him on T collar  Medications: Reviewed on Rounds  Physical Exam:  Vitals: Temperature 98.6 pulse 78 respiratory rate 22 blood pressure 118/49 saturations 98%  Ventilator Settings mode of ventilation assist control FiO2 40% tidal volume 450 PEEP 5  . General: Comfortable at this time . Eyes: Grossly normal lids, irises & conjunctiva . ENT: grossly tongue is normal . Neck: no obvious mass . Cardiovascular: S1 S2 normal no gallop . Respiratory: Coarse breath sounds no rhonchi . Abdomen: soft . Skin: no rash seen on limited exam . Musculoskeletal: not rigid . Psychiatric:unable to assess . Neurologic: no seizure no involuntary movements         Lab Data:   Basic Metabolic Panel: Recent Labs  Lab 12/15/17 0539  NA 137  K 4.0  CL 92*  CO2 35*  GLUCOSE 137*  BUN 27*  CREATININE 0.50*  CALCIUM 9.1    ABG: Recent Labs  Lab 12/12/17 1904 12/14/17 1000  PHART 7.405 7.428  PCO2ART 69.6* 67.9*  PO2ART 51.6* 105  HCO3 42.7* 44.1*  O2SAT 86.2 98.2    Liver Function Tests: No results for input(s): AST, ALT, ALKPHOS, BILITOT, PROT, ALBUMIN in the last 168 hours. No results for input(s): LIPASE, AMYLASE in the last 168 hours. No results for input(s): AMMONIA in the last 168 hours.  CBC: Recent Labs  Lab 12/13/17 0704  WBC 12.1*  HGB 8.7*  HCT 29.4*  MCV 109.7*  PLT 189     Cardiac Enzymes: No results for input(s): CKTOTAL, CKMB, CKMBINDEX, TROPONINI in the last 168 hours.  BNP (last 3 results) No results for input(s): BNP in the last 8760 hours.  ProBNP (last 3 results) No results for input(s): PROBNP in the last 8760 hours.  Radiological Exams: No results found.  Assessment/Plan Principal Problem:   Acute on chronic respiratory failure with hypoxia (HCC) Active Problems:   Severe sepsis with septic shock (CODE) (HCC)   Chronic atrial fibrillation   Metabolic encephalopathy   COPD, severe (HCC)   Aspiration pneumonia due to gastric secretions (HCC)   Delirium due to another medical condition   1. Acute on chronic respiratory failure with hypoxia we will continue with advancing the wean as discussed above.  Patient should be able to go to T collar today as discussed on rounds. 2. Severe sepsis with shock right now hemodynamically stable continue present therapy 3. Chronic atrial fibrillation rate is controlled we will continue with supportive care 4. Complete metabolic encephalopathy remains about the same 5. Severe COPD at baseline right now 6. Aspiration pneumonia follow x-ray as necessary clinically resolved 7. Delirium psychiatry is following along   I have personally seen and evaluated the patient, evaluated laboratory and imaging results, formulated the assessment and plan and placed orders. The Patient requires high complexity decision making for assessment and support.  Case was discussed on Rounds with  the Respiratory Therapy Staff  Allyne Gee, MD [facility] Pulmonary Critical Care Medicine Sleep Medicine

## 2017-12-20 DIAGNOSIS — J449 Chronic obstructive pulmonary disease, unspecified: Secondary | ICD-10-CM | POA: Diagnosis not present

## 2017-12-20 DIAGNOSIS — I482 Chronic atrial fibrillation, unspecified: Secondary | ICD-10-CM | POA: Diagnosis not present

## 2017-12-20 DIAGNOSIS — J9621 Acute and chronic respiratory failure with hypoxia: Secondary | ICD-10-CM | POA: Diagnosis not present

## 2017-12-20 DIAGNOSIS — J690 Pneumonitis due to inhalation of food and vomit: Secondary | ICD-10-CM | POA: Diagnosis not present

## 2017-12-20 NOTE — Progress Notes (Signed)
Pulmonary Critical Care Medicine [facility] [place]   PULMONARY CRITICAL CARE SERVICE  PROGRESS NOTE  Date of Service: 12/20/2017  Daniel Irwin  ZOX:[id number]  DOB: 07-Jun-1955   DOA: 11/15/2017  Referring Physician: Carron Curie, MD  HPI: Daniel Irwin is a 62 y.o. male seen for follow up of Acute on Chronic Respiratory Failure.  Patient is back on a wean this morning on T collar.  Apparently overnight had to be placed on the ventilator around 4 AM for desaturation reportedly right now seems to be comfortable.  Medications: Reviewed on Rounds  Physical Exam:  Vitals: Temperature 97.8 pulse 84 respiratory rate 25 blood pressure 97/55 saturations 98%  Ventilator Settings currently is on T collar FiO2 28%  . General: Comfortable at this time . Eyes: Grossly normal lids, irises & conjunctiva . ENT: grossly tongue is normal . Neck: no obvious mass . Cardiovascular: S1 S2 normal no gallop . Respiratory: No rhonchi or rales are noted . Abdomen: soft . Skin: no rash seen on limited exam . Musculoskeletal: not rigid . Psychiatric:unable to assess . Neurologic: no seizure no involuntary movements         Lab Data:   Basic Metabolic Panel: Recent Labs  Lab 12/15/17 0539  NA 137  K 4.0  CL 92*  CO2 35*  GLUCOSE 137*  BUN 27*  CREATININE 0.50*  CALCIUM 9.1    ABG: Recent Labs  Lab 12/14/17 1000  PHART 7.428  PCO2ART 67.9*  PO2ART 105  HCO3 44.1*  O2SAT 98.2    Liver Function Tests: No results for input(s): AST, ALT, ALKPHOS, BILITOT, PROT, ALBUMIN in the last 168 hours. No results for input(s): LIPASE, AMYLASE in the last 168 hours. No results for input(s): AMMONIA in the last 168 hours.  CBC: No results for input(s): WBC, NEUTROABS, HGB, HCT, MCV, PLT in the last 168 hours.  Cardiac Enzymes: No results for input(s): CKTOTAL, CKMB, CKMBINDEX, TROPONINI in the last 168 hours.  BNP (last 3 results) No results for input(s): BNP in the last 8760  hours.  ProBNP (last 3 results) No results for input(s): PROBNP in the last 8760 hours.  Radiological Exams: No results found.  Assessment/Plan Principal Problem:   Acute on chronic respiratory failure with hypoxia (HCC) Active Problems:   Severe sepsis with septic shock (CODE) (HCC)   Chronic atrial fibrillation   Metabolic encephalopathy   COPD, severe (HCC)   Aspiration pneumonia due to gastric secretions (HCC)   Delirium due to another medical condition   1. Acute on chronic respiratory failure with hypoxia we will continue weaning on the T collar as tolerated.  Hopefully we can continue past 24-hour mark again continue pulmonary toilet secretion management. 2. Severe sepsis with shock hemodynamically stable we will continue to follow 3. Chronic atrial fibrillation rate is controlled 4. Metabolic encephalopathy grossly unchanged 5. Severe COPD continue with supportive care present management 6. Aspiration pneumonia stable we will monitor has been treated   I have personally seen and evaluated the patient, evaluated laboratory and imaging results, formulated the assessment and plan and placed orders. The Patient requires high complexity decision making for assessment and support.  Case was discussed on Rounds with the Respiratory Therapy Staff  Yevonne Pax, MD [facility] Pulmonary Critical Care Medicine Sleep Medicine

## 2017-12-21 ENCOUNTER — Other Ambulatory Visit (HOSPITAL_COMMUNITY): Payer: Medicaid - Out of State

## 2017-12-21 DIAGNOSIS — J449 Chronic obstructive pulmonary disease, unspecified: Secondary | ICD-10-CM | POA: Diagnosis not present

## 2017-12-21 DIAGNOSIS — J690 Pneumonitis due to inhalation of food and vomit: Secondary | ICD-10-CM | POA: Diagnosis not present

## 2017-12-21 DIAGNOSIS — J9621 Acute and chronic respiratory failure with hypoxia: Secondary | ICD-10-CM | POA: Diagnosis not present

## 2017-12-21 DIAGNOSIS — I482 Chronic atrial fibrillation, unspecified: Secondary | ICD-10-CM | POA: Diagnosis not present

## 2017-12-21 NOTE — Progress Notes (Signed)
Pulmonary Critical Care Medicine [facility] [place]   PULMONARY CRITICAL CARE SERVICE  PROGRESS NOTE  Date of Service: 12/21/2017  Daniel Irwin  ZOX:[id number]  DOB: 05-22-1955   DOA: 11/15/2017  Referring Physician: Carron Curie, MD  HPI: Daniel Irwin is a 62 y.o. male seen for follow up of Acute on Chronic Respiratory Failure.  Patient is on T collar went down for modified barium swallow and failed patient still has the NG tube in place supposed to get a PEG placed  Medications: Reviewed on Rounds  Physical Exam:  Vitals: Temperature 98.6 pulse 69 respiratory rate 30 blood pressure 110/59 saturations 98%  Ventilator Settings currently on T collar FiO2 28%  . General: Comfortable at this time . Eyes: Grossly normal lids, irises & conjunctiva . ENT: grossly tongue is normal . Neck: no obvious mass . Cardiovascular: S1 S2 normal no gallop . Respiratory: No rhonchi or rales are noted . Abdomen: soft . Skin: no rash seen on limited exam . Musculoskeletal: not rigid . Psychiatric:unable to assess . Neurologic: no seizure no involuntary movements         Lab Data:   Basic Metabolic Panel: Recent Labs  Lab 12/15/17 0539  NA 137  K 4.0  CL 92*  CO2 35*  GLUCOSE 137*  BUN 27*  CREATININE 0.50*  CALCIUM 9.1    ABG: No results for input(s): PHART, PCO2ART, PO2ART, HCO3, O2SAT in the last 168 hours.  Liver Function Tests: No results for input(s): AST, ALT, ALKPHOS, BILITOT, PROT, ALBUMIN in the last 168 hours. No results for input(s): LIPASE, AMYLASE in the last 168 hours. No results for input(s): AMMONIA in the last 168 hours.  CBC: No results for input(s): WBC, NEUTROABS, HGB, HCT, MCV, PLT in the last 168 hours.  Cardiac Enzymes: No results for input(s): CKTOTAL, CKMB, CKMBINDEX, TROPONINI in the last 168 hours.  BNP (last 3 results) No results for input(s): BNP in the last 8760 hours.  ProBNP (last 3 results) No results for input(s): PROBNP in  the last 8760 hours.  Radiological Exams: No results found.  Assessment/Plan Principal Problem:   Acute on chronic respiratory failure with hypoxia (HCC) Active Problems:   Severe sepsis with septic shock (CODE) (HCC)   Chronic atrial fibrillation   Metabolic encephalopathy   COPD, severe (HCC)   Aspiration pneumonia due to gastric secretions (HCC)   Delirium due to another medical condition   1. Acute on chronic respiratory failure with hypoxia we will continue with weaning on T collar as tolerated.  Continue pulmonary toilet supportive care. 2. Severe sepsis shock resolved 3. Chronic atrial fibrillation rate is controlled 4. Metabolic encephalopathy grossly unchanged 5. Severe COPD at baseline 6. Aspiration pneumonia stable we will continue to monitor   I have personally seen and evaluated the patient, evaluated laboratory and imaging results, formulated the assessment and plan and placed orders. The Patient requires high complexity decision making for assessment and support.  Case was discussed on Rounds with the Respiratory Therapy Staff  Yevonne Pax, MD [facility] Pulmonary Critical Care Medicine Sleep Medicine

## 2017-12-22 DIAGNOSIS — J449 Chronic obstructive pulmonary disease, unspecified: Secondary | ICD-10-CM | POA: Diagnosis not present

## 2017-12-22 DIAGNOSIS — J9621 Acute and chronic respiratory failure with hypoxia: Secondary | ICD-10-CM | POA: Diagnosis not present

## 2017-12-22 DIAGNOSIS — J690 Pneumonitis due to inhalation of food and vomit: Secondary | ICD-10-CM | POA: Diagnosis not present

## 2017-12-22 DIAGNOSIS — I482 Chronic atrial fibrillation, unspecified: Secondary | ICD-10-CM | POA: Diagnosis not present

## 2017-12-22 LAB — CBC
HCT: 31.5 % — ABNORMAL LOW (ref 39.0–52.0)
Hemoglobin: 9.7 g/dL — ABNORMAL LOW (ref 13.0–17.0)
MCH: 32.6 pg (ref 26.0–34.0)
MCHC: 30.8 g/dL (ref 30.0–36.0)
MCV: 105.7 fL — AB (ref 80.0–100.0)
PLATELETS: 269 10*3/uL (ref 150–400)
RBC: 2.98 MIL/uL — ABNORMAL LOW (ref 4.22–5.81)
RDW: 17 % — ABNORMAL HIGH (ref 11.5–15.5)
WBC: 9.6 10*3/uL (ref 4.0–10.5)
nRBC: 0 % (ref 0.0–0.2)

## 2017-12-22 LAB — BASIC METABOLIC PANEL
Anion gap: 7 (ref 5–15)
BUN: 35 mg/dL — ABNORMAL HIGH (ref 8–23)
CALCIUM: 9.5 mg/dL (ref 8.9–10.3)
CO2: 34 mmol/L — ABNORMAL HIGH (ref 22–32)
Chloride: 101 mmol/L (ref 98–111)
Creatinine, Ser: 0.64 mg/dL (ref 0.61–1.24)
GLUCOSE: 144 mg/dL — AB (ref 70–99)
Potassium: 4.1 mmol/L (ref 3.5–5.1)
Sodium: 142 mmol/L (ref 135–145)

## 2017-12-22 LAB — PROTIME-INR
INR: 1.22
PROTHROMBIN TIME: 15.3 s — AB (ref 11.4–15.2)

## 2017-12-22 MED ORDER — CEFAZOLIN SODIUM-DEXTROSE 2-4 GM/100ML-% IV SOLN: 2.0000 g | INTRAVENOUS | Status: AC

## 2017-12-22 NOTE — Progress Notes (Signed)
Pulmonary Critical Care Medicine [facility] [place]   PULMONARY CRITICAL CARE SERVICE  PROGRESS NOTE  Date of Service: 12/22/2017  Daniel Irwin  ZOX:[id number]  DOB: Jul 07, 1955   DOA: 11/15/2017  Referring Physician: Carron Curie, MD  HPI: Daniel Irwin is a 62 y.o. male seen for follow up of Acute on Chronic Respiratory Failure.  Patient is on T collar has been off the ventilator now for 48 hours looks good still has thick sputum for which Mucomyst was added.  Right now resting comfortably without distress  Medications: Reviewed on Rounds  Physical Exam:  Vitals: Temperature 96.6 pulse 82 respiratory rate 30 blood pressure 100/65 saturations 97%  Ventilator Settings currently is off the ventilator on T collar as noted above  . General: Comfortable at this time . Eyes: Grossly normal lids, irises & conjunctiva . ENT: grossly tongue is normal . Neck: no obvious mass . Cardiovascular: S1 S2 normal no gallop . Respiratory: Coarse breath sounds no rhonchi noted . Abdomen: soft . Skin: no rash seen on limited exam . Musculoskeletal: not rigid . Psychiatric:unable to assess . Neurologic: no seizure no involuntary movements         Lab Data:   Basic Metabolic Panel: Recent Labs  Lab 12/22/17 0555  NA 142  K 4.1  CL 101  CO2 34*  GLUCOSE 144*  BUN 35*  CREATININE 0.64  CALCIUM 9.5    ABG: No results for input(s): PHART, PCO2ART, PO2ART, HCO3, O2SAT in the last 168 hours.  Liver Function Tests: No results for input(s): AST, ALT, ALKPHOS, BILITOT, PROT, ALBUMIN in the last 168 hours. No results for input(s): LIPASE, AMYLASE in the last 168 hours. No results for input(s): AMMONIA in the last 168 hours.  CBC: Recent Labs  Lab 12/22/17 0555  WBC 9.6  HGB 9.7*  HCT 31.5*  MCV 105.7*  PLT 269    Cardiac Enzymes: No results for input(s): CKTOTAL, CKMB, CKMBINDEX, TROPONINI in the last 168 hours.  BNP (last 3 results) No results for input(s): BNP in  the last 8760 hours.  ProBNP (last 3 results) No results for input(s): PROBNP in the last 8760 hours.  Radiological Exams: No results found.  Assessment/Plan Principal Problem:   Acute on chronic respiratory failure with hypoxia (HCC) Active Problems:   Severe sepsis with septic shock (CODE) (HCC)   Chronic atrial fibrillation   Metabolic encephalopathy   COPD, severe (HCC)   Aspiration pneumonia due to gastric secretions (HCC)   Delirium due to another medical condition   1. Acute on chronic respiratory failure with hypoxia we will continue with T collar Mucomyst was added for the thick secretions.  We will continue pulmonary toilet supportive care. 2. Chronic atrial fibrillation rate is controlled we will continue to follow 3. Metabolic encephalopathy at baseline we will continue with present management 4. Severe COPD advanced disease limited reserve prognosis guarded 5. Aspiration pneumonia treated we will continue to follow 6. Delirium he is at his baseline right now   I have personally seen and evaluated the patient, evaluated laboratory and imaging results, formulated the assessment and plan and placed orders. The Patient requires high complexity decision making for assessment and support.  Case was discussed on Rounds with the Respiratory Therapy Staff  Yevonne Pax, MD [facility] Pulmonary Critical Care Medicine Sleep Medicine

## 2017-12-22 NOTE — Progress Notes (Signed)
Patient planned for gastrostomy tube placement in IR tomorrow (10/10) - shingles has resolved, patient is afebrile, WBC 9.6, H/H 9.7/31.5 which is baseline for patient, INR 1.22.  Spoke with patient's RN Mel who states patient is currently receiving Lovenox with last dose given this AM. Orders given for tube feeds to be stopped at midnight on 10/10 and Lovenox to be held starting this evening to which RN states understanding.  IR will call for patient when ready - please call with any questions or concerns.  Lynnette Caffey, PA-C 12/22/2017 1423

## 2017-12-23 ENCOUNTER — Other Ambulatory Visit (HOSPITAL_COMMUNITY): Payer: Medicaid - Out of State

## 2017-12-23 ENCOUNTER — Encounter (HOSPITAL_COMMUNITY): Payer: Self-pay | Admitting: Interventional Radiology

## 2017-12-23 DIAGNOSIS — J449 Chronic obstructive pulmonary disease, unspecified: Secondary | ICD-10-CM | POA: Diagnosis not present

## 2017-12-23 DIAGNOSIS — J9621 Acute and chronic respiratory failure with hypoxia: Secondary | ICD-10-CM | POA: Diagnosis not present

## 2017-12-23 DIAGNOSIS — J690 Pneumonitis due to inhalation of food and vomit: Secondary | ICD-10-CM | POA: Diagnosis not present

## 2017-12-23 DIAGNOSIS — I482 Chronic atrial fibrillation, unspecified: Secondary | ICD-10-CM | POA: Diagnosis not present

## 2017-12-23 HISTORY — PX: IR GASTROSTOMY TUBE MOD SED: IMG625

## 2017-12-23 MED ORDER — IOPAMIDOL (ISOVUE-300) INJECTION 61%
INTRAVENOUS | Status: AC
  Administered 2017-12-23: 10 mL
  Filled 2017-12-23: qty 50

## 2017-12-23 MED ORDER — LIDOCAINE HCL 1 % IJ SOLN
INTRAMUSCULAR | Status: AC
  Filled 2017-12-23: qty 20

## 2017-12-23 MED ORDER — CEFAZOLIN SODIUM-DEXTROSE 1-4 GM/50ML-% IV SOLN
INTRAVENOUS | Status: AC | PRN
  Administered 2017-12-23: 2 g via INTRAVENOUS

## 2017-12-23 MED ORDER — FENTANYL CITRATE (PF) 100 MCG/2ML IJ SOLN
INTRAMUSCULAR | Status: AC
  Filled 2017-12-23: qty 2

## 2017-12-23 MED ORDER — LIDOCAINE HCL 1 % IJ SOLN
INTRAMUSCULAR | Status: AC | PRN
  Administered 2017-12-23: 10 mL

## 2017-12-23 MED ORDER — MIDAZOLAM HCL 2 MG/2ML IJ SOLN
INTRAMUSCULAR | Status: AC | PRN
  Administered 2017-12-23: 1 mg via INTRAVENOUS

## 2017-12-23 MED ORDER — CEFAZOLIN SODIUM-DEXTROSE 2-4 GM/100ML-% IV SOLN
INTRAVENOUS | Status: AC
  Filled 2017-12-23: qty 100

## 2017-12-23 MED ORDER — MIDAZOLAM HCL 2 MG/2ML IJ SOLN
INTRAMUSCULAR | Status: AC
  Filled 2017-12-23: qty 2

## 2017-12-23 MED ORDER — FENTANYL CITRATE (PF) 100 MCG/2ML IJ SOLN
INTRAMUSCULAR | Status: AC | PRN
  Administered 2017-12-23: 25 ug via INTRAVENOUS

## 2017-12-23 NOTE — Sedation Documentation (Signed)
Received report from [place], [place]. Pt is stable at this time with no complaints

## 2017-12-23 NOTE — Procedures (Signed)
Interventional Radiology Procedure Note  Procedure: Placement of percutaneous 20F pull-through gastrostomy tube. Complications: None Recommendations: - NPO except for sips and chips remainder of today and overnight - Maintain G-tube to LWS until tomorrow morning  - May advance diet as tolerated and begin using tube tomorrow morning  Signed,  Heath K. McCullough, MD   

## 2017-12-23 NOTE — Sedation Documentation (Signed)
Patient is resting comfortably. 

## 2017-12-23 NOTE — Progress Notes (Signed)
Pulmonary Critical Care Medicine [facility] [place]   PULMONARY CRITICAL CARE SERVICE  PROGRESS NOTE  Date of Service: 12/23/2017  Daniel Irwin  GEX:[id number]  DOB: 10-30-1955   DOA: 11/15/2017  Referring Physician: Carron Curie, MD  HPI: Daniel Irwin is a 62 y.o. male seen for follow up of Acute on Chronic Respiratory Failure.  Currently comfortable without distress.  Patient is on T collar has been on 28% oxygen  Medications: Reviewed on Rounds  Physical Exam:  Vitals: Temperature 96.7 pulse 71 respiratory rate 30 blood pressure 99/43 saturation 7 to 97%  Ventilator Settings off the ventilator on T collar  . General: Comfortable at this time . Eyes: Grossly normal lids, irises & conjunctiva . ENT: grossly tongue is normal . Neck: no obvious mass . Cardiovascular: S1 S2 normal no gallop . Respiratory: No rhonchi or rales are noted . Abdomen: soft . Skin: no rash seen on limited exam . Musculoskeletal: not rigid . Psychiatric:unable to assess . Neurologic: no seizure no involuntary movements         Lab Data:   Basic Metabolic Panel: Recent Labs  Lab 12/22/17 0555  NA 142  K 4.1  CL 101  CO2 34*  GLUCOSE 144*  BUN 35*  CREATININE 0.64  CALCIUM 9.5    ABG: No results for input(s): PHART, PCO2ART, PO2ART, HCO3, O2SAT in the last 168 hours.  Liver Function Tests: No results for input(s): AST, ALT, ALKPHOS, BILITOT, PROT, ALBUMIN in the last 168 hours. No results for input(s): LIPASE, AMYLASE in the last 168 hours. No results for input(s): AMMONIA in the last 168 hours.  CBC: Recent Labs  Lab 12/22/17 0555  WBC 9.6  HGB 9.7*  HCT 31.5*  MCV 105.7*  PLT 269    Cardiac Enzymes: No results for input(s): CKTOTAL, CKMB, CKMBINDEX, TROPONINI in the last 168 hours.  BNP (last 3 results) No results for input(s): BNP in the last 8760 hours.  ProBNP (last 3 results) No results for input(s): PROBNP in the last 8760 hours.  Radiological  Exams: Ir Gastrostomy Tube Mod Sed  Result Date: 12/23/2017 INDICATION: 62 year old male with dysphagia in need of a percutaneous gastrostomy tube. EXAM: Fluoroscopically guided placement of percutaneous pull-through gastrostomy tube Interventional Radiologist:  Sterling Big, MD MEDICATIONS: 2 g Ancef; Antibiotics were administered within 1 hour of the procedure. ANESTHESIA/SEDATION: Versed 1 mg IV; Fentanyl 25 mcg IV Moderate Sedation Time:  10 minutes The patient was continuously monitored during the procedure by the interventional radiology nurse under my direct supervision. CONTRAST:  20 mL Isovue 370 FLUOROSCOPY TIME:  Fluoroscopy Time: 3 minutes 18 seconds (6 mGy). COMPLICATIONS: None immediate. PROCEDURE: Informed written consent was obtained from the patient after a thorough discussion of the procedural risks, benefits and alternatives. All questions were addressed. Maximal Sterile Barrier Technique was utilized including caps, mask, sterile gowns, sterile gloves, sterile drape, hand hygiene and skin antiseptic. A timeout was performed prior to the initiation of the procedure. Maximal barrier sterile technique utilized including caps, mask, sterile gowns, sterile gloves, large sterile drape, hand hygiene, and chlorhexadine skin prep. An angled catheter was advanced over a wire under fluoroscopic guidance through the nose, down the esophagus and into the body of the stomach. The stomach was then insufflated with several 100 ml of air. Fluoroscopy confirmed location of the gastric bubble, as well as inferior displacement of the barium stained colon. Under direct fluoroscopic guidance, a single T-tack was placed, and the anterior gastric wall drawn up against  the anterior abdominal wall. Percutaneous access was then obtained into the mid gastric body with an 18 gauge sheath needle. Aspiration of air, and injection of contrast material under fluoroscopy confirmed needle placement. An Amplatz wire was  advanced in the gastric body and the access needle exchanged for a 9-French vascular sheath. A snare device was advanced through the vascular sheath and an Amplatz wire advanced through the angled catheter. The Amplatz wire was successfully snared and this was pulled up through the esophagus and out the mouth. A 20-French Burnell Blanks MIC-PEG tube was then connected to the snare and pulled through the mouth, down the esophagus, into the stomach and out to the anterior abdominal wall. Hand injection of contrast material confirmed intragastric location. The T-tack retention suture was then cut. The pull through peg tube was then secured with the external bumper and capped. The patient will be observed for several hours with the newly placed tube on low wall suction to evaluate for any post procedure complication. The patient tolerated the procedure well, there is no immediate complication. IMPRESSION: Successful placement of a 20 French pull through gastrostomy tube. Electronically Signed   By: Malachy Moan M.D.   On: 12/23/2017 16:51    Assessment/Plan Principal Problem:   Acute on chronic respiratory failure with hypoxia (HCC) Active Problems:   Severe sepsis with septic shock (CODE) (HCC)   Chronic atrial fibrillation   Metabolic encephalopathy   COPD, severe (HCC)   Aspiration pneumonia due to gastric secretions (HCC)   Delirium due to another medical condition   1. Acute on chronic respiratory failure with hypoxia we will continue with weaning on T collar continue pulmonary toilet secretion management. 2. Severe sepsis with shock right now is hemodynamically stable continue with present therapy 3. Chronic atrial fibrillation rate is controlled continue to follow 4. Metabolic encephalopathy grossly unchanged we will continue to monitor 5. Severe COPD at baseline 6. Aspiration pneumonia treated 7. Delirium patient is at baseline right now   I have personally seen and evaluated the  patient, evaluated laboratory and imaging results, formulated the assessment and plan and placed orders. The Patient requires high complexity decision making for assessment and support.  Case was discussed on Rounds with the Respiratory Therapy Staff  Yevonne Pax, MD [facility] Pulmonary Critical Care Medicine Sleep Medicine

## 2017-12-23 NOTE — Sedation Documentation (Signed)
Procedure finished. Pt tolerated very well. 

## 2017-12-23 NOTE — Sedation Documentation (Addendum)
Unable to monitor ETCO2. Patient on Trach collar at 35%

## 2017-12-24 DIAGNOSIS — J690 Pneumonitis due to inhalation of food and vomit: Secondary | ICD-10-CM | POA: Diagnosis not present

## 2017-12-24 DIAGNOSIS — J9621 Acute and chronic respiratory failure with hypoxia: Secondary | ICD-10-CM | POA: Diagnosis not present

## 2017-12-24 DIAGNOSIS — I482 Chronic atrial fibrillation, unspecified: Secondary | ICD-10-CM | POA: Diagnosis not present

## 2017-12-24 DIAGNOSIS — J449 Chronic obstructive pulmonary disease, unspecified: Secondary | ICD-10-CM | POA: Diagnosis not present

## 2017-12-24 NOTE — Progress Notes (Signed)
Pulmonary Critical Care Medicine [facility] [place]   PULMONARY CRITICAL CARE SERVICE  PROGRESS NOTE  Date of Service: 12/24/2017  Daniel Irwin  ZOX:[id number]  DOB: 1955/03/22   DOA: 11/15/2017  Referring Physician: Carron Curie, MD  HPI: Daniel Irwin is a 62 y.o. male seen for follow up of Acute on Chronic Respiratory Failure.  He is on his T collar at this time patient is comfortable without distress.  Has been on 28% FiO2  Medications: Reviewed on Rounds  Physical Exam:  Vitals: Temperature 98.3 pulse 76 respiratory rate 30 blood pressure is 195/68 saturation 94%  Ventilator Settings off the ventilator on T collar  . General: Comfortable at this time . Eyes: Grossly normal lids, irises & conjunctiva . ENT: grossly tongue is normal . Neck: no obvious mass . Cardiovascular: S1 S2 normal no gallop . Respiratory: Coarse breath sounds no rhonchi . Abdomen: soft . Skin: no rash seen on limited exam . Musculoskeletal: not rigid . Psychiatric:unable to assess . Neurologic: no seizure no involuntary movements         Lab Data:   Basic Metabolic Panel: Recent Labs  Lab 12/22/17 0555  NA 142  K 4.1  CL 101  CO2 34*  GLUCOSE 144*  BUN 35*  CREATININE 0.64  CALCIUM 9.5    ABG: No results for input(s): PHART, PCO2ART, PO2ART, HCO3, O2SAT in the last 168 hours.  Liver Function Tests: No results for input(s): AST, ALT, ALKPHOS, BILITOT, PROT, ALBUMIN in the last 168 hours. No results for input(s): LIPASE, AMYLASE in the last 168 hours. No results for input(s): AMMONIA in the last 168 hours.  CBC: Recent Labs  Lab 12/22/17 0555  WBC 9.6  HGB 9.7*  HCT 31.5*  MCV 105.7*  PLT 269    Cardiac Enzymes: No results for input(s): CKTOTAL, CKMB, CKMBINDEX, TROPONINI in the last 168 hours.  BNP (last 3 results) No results for input(s): BNP in the last 8760 hours.  ProBNP (last 3 results) No results for input(s): PROBNP in the last 8760  hours.  Radiological Exams: Ir Gastrostomy Tube Mod Sed  Result Date: 12/23/2017 INDICATION: 62 year old male with dysphagia in need of a percutaneous gastrostomy tube. EXAM: Fluoroscopically guided placement of percutaneous pull-through gastrostomy tube Interventional Radiologist:  Sterling Big, MD MEDICATIONS: 2 g Ancef; Antibiotics were administered within 1 hour of the procedure. ANESTHESIA/SEDATION: Versed 1 mg IV; Fentanyl 25 mcg IV Moderate Sedation Time:  10 minutes The patient was continuously monitored during the procedure by the interventional radiology nurse under my direct supervision. CONTRAST:  20 mL Isovue 370 FLUOROSCOPY TIME:  Fluoroscopy Time: 3 minutes 18 seconds (6 mGy). COMPLICATIONS: None immediate. PROCEDURE: Informed written consent was obtained from the patient after a thorough discussion of the procedural risks, benefits and alternatives. All questions were addressed. Maximal Sterile Barrier Technique was utilized including caps, mask, sterile gowns, sterile gloves, sterile drape, hand hygiene and skin antiseptic. A timeout was performed prior to the initiation of the procedure. Maximal barrier sterile technique utilized including caps, mask, sterile gowns, sterile gloves, large sterile drape, hand hygiene, and chlorhexadine skin prep. An angled catheter was advanced over a wire under fluoroscopic guidance through the nose, down the esophagus and into the body of the stomach. The stomach was then insufflated with several 100 ml of air. Fluoroscopy confirmed location of the gastric bubble, as well as inferior displacement of the barium stained colon. Under direct fluoroscopic guidance, a single T-tack was placed, and the anterior gastric wall  drawn up against the anterior abdominal wall. Percutaneous access was then obtained into the mid gastric body with an 18 gauge sheath needle. Aspiration of air, and injection of contrast material under fluoroscopy confirmed needle  placement. An Amplatz wire was advanced in the gastric body and the access needle exchanged for a 9-French vascular sheath. A snare device was advanced through the vascular sheath and an Amplatz wire advanced through the angled catheter. The Amplatz wire was successfully snared and this was pulled up through the esophagus and out the mouth. A 20-French Burnell Blanks MIC-PEG tube was then connected to the snare and pulled through the mouth, down the esophagus, into the stomach and out to the anterior abdominal wall. Hand injection of contrast material confirmed intragastric location. The T-tack retention suture was then cut. The pull through peg tube was then secured with the external bumper and capped. The patient will be observed for several hours with the newly placed tube on low wall suction to evaluate for any post procedure complication. The patient tolerated the procedure well, there is no immediate complication. IMPRESSION: Successful placement of a 20 French pull through gastrostomy tube. Electronically Signed   By: Malachy Moan M.D.   On: 12/23/2017 16:51    Assessment/Plan Principal Problem:   Acute on chronic respiratory failure with hypoxia (HCC) Active Problems:   Severe sepsis with septic shock (CODE) (HCC)   Chronic atrial fibrillation   Metabolic encephalopathy   COPD, severe (HCC)   Aspiration pneumonia due to gastric secretions (HCC)   Delirium due to another medical condition   1. Acute on chronic respiratory failure with hypoxia patient is going to continue with T collar trials will continue secretion management.  Patient is n.p.o. had a G-tube placed for feeding. 2. Severe sepsis resolved dynamically stable 3. Chronic atrial fibrillation rate is controlled 4. Metabolic encephalopathy he is at base baseline 5. Severe COPD continue present management seems to be doing fine 6. Pneumonia due to aspiration clinically resolved   I have personally seen and evaluated the  patient, evaluated laboratory and imaging results, formulated the assessment and plan and placed orders. The Patient requires high complexity decision making for assessment and support.  Case was discussed on Rounds with the Respiratory Therapy Staff  Yevonne Pax, MD [facility] Pulmonary Critical Care Medicine Sleep Medicine

## 2017-12-24 NOTE — Progress Notes (Signed)
Referring Physician(s): Hijazi  Supervising Physician: Richarda Overlie  Patient Status:  [facility] - In-pt  Chief Complaint:  F/u G-tube placement  Subjective:  Daniel Irwin is doing ok. He is sitting up in bed. He is asking for water. He has no complaints. No complaints regarding the G-tube.  Allergies: Patient has no allergy information on record.  Medications: Prior to Admission medications   Not on File     Vital Signs: BP 118/75   Pulse 77   Resp (!) 30   SpO2 99%   Physical Exam Awake and alert NAD Gastrostomy tube in place. No drainage or bleeding. Ok to start tube feeds.  Imaging: Ir Gastrostomy Tube Mod Sed  Result Date: 12/23/2017 INDICATION: 62 year old male with dysphagia in need of a percutaneous gastrostomy tube. EXAM: Fluoroscopically guided placement of percutaneous pull-through gastrostomy tube Interventional Radiologist:  Sterling Big, MD MEDICATIONS: 2 g Ancef; Antibiotics were administered within 1 hour of the procedure. ANESTHESIA/SEDATION: Versed 1 mg IV; Fentanyl 25 mcg IV Moderate Sedation Time:  10 minutes The patient was continuously monitored during the procedure by the interventional radiology nurse under my direct supervision. CONTRAST:  20 mL Isovue 370 FLUOROSCOPY TIME:  Fluoroscopy Time: 3 minutes 18 seconds (6 mGy). COMPLICATIONS: None immediate. PROCEDURE: Informed written consent was obtained from the patient after a thorough discussion of the procedural risks, benefits and alternatives. All questions were addressed. Maximal Sterile Barrier Technique was utilized including caps, mask, sterile gowns, sterile gloves, sterile drape, hand hygiene and skin antiseptic. A timeout was performed prior to the initiation of the procedure. Maximal barrier sterile technique utilized including caps, mask, sterile gowns, sterile gloves, large sterile drape, hand hygiene, and chlorhexadine skin prep. An angled catheter was advanced over a wire under  fluoroscopic guidance through the nose, down the esophagus and into the body of the stomach. The stomach was then insufflated with several 100 ml of air. Fluoroscopy confirmed location of the gastric bubble, as well as inferior displacement of the barium stained colon. Under direct fluoroscopic guidance, a single T-tack was placed, and the anterior gastric wall drawn up against the anterior abdominal wall. Percutaneous access was then obtained into the mid gastric body with an 18 gauge sheath needle. Aspiration of air, and injection of contrast material under fluoroscopy confirmed needle placement. An Amplatz wire was advanced in the gastric body and the access needle exchanged for a 9-French vascular sheath. A snare device was advanced through the vascular sheath and an Amplatz wire advanced through the angled catheter. The Amplatz wire was successfully snared and this was pulled up through the esophagus and out the mouth. A 20-French Burnell Blanks MIC-PEG tube was then connected to the snare and pulled through the mouth, down the esophagus, into the stomach and out to the anterior abdominal wall. Hand injection of contrast material confirmed intragastric location. The T-tack retention suture was then cut. The pull through peg tube was then secured with the external bumper and capped. The patient will be observed for several hours with the newly placed tube on low wall suction to evaluate for any post procedure complication. The patient tolerated the procedure well, there is no immediate complication. IMPRESSION: Successful placement of a 20 French pull through gastrostomy tube. Electronically Signed   By: Malachy Moan M.D.   On: 12/23/2017 16:51    Labs:  CBC: Recent Labs    12/08/17 0544 12/09/17 0435 12/13/17 0704 12/22/17 0555  WBC 17.5* 17.9* 12.1* 9.6  HGB 9.4* 9.4*  8.7* 9.7*  HCT 30.6* 30.3* 29.4* 31.5*  PLT 298 290 189 269    COAGS: Recent Labs    11/16/17 0638 12/06/17 0720  12/22/17 1005  INR 1.24 1.10 1.22    BMP: Recent Labs    11/27/17 0650 12/06/17 0720 12/15/17 0539 12/22/17 0555  NA 136 142 137 142  K 4.2 4.2 4.0 4.1  CL 92* 96* 92* 101  CO2 34* 39* 35* 34*  GLUCOSE 186* 85 137* 144*  BUN 25* 24* 27* 35*  CALCIUM 9.4 9.8 9.1 9.5  CREATININE 0.51* 0.49* 0.50* 0.64  GFRNONAA >60 >60 >60 >60  GFRAA >60 >60 >60 >60    LIVER FUNCTION TESTS: Recent Labs    11/16/17 0638  BILITOT 0.8  AST 18  ALT 31  ALKPHOS 97  PROT 5.4*  ALBUMIN 2.6*    Assessment and Plan:  Severe COPD exacerbation with vent dependence/tracheostomy  S/P Gastrostomy tube placement yesterday by Dr. Archer Asa  Looks good. Ok to start tube feeds and routine G-tube care.  Electronically Signed: Gwynneth Macleod, PA-C 12/24/2017, 11:02 AM    I spent a total of 15 Minutes at the the patient's bedside AND on the patient's hospital floor or unit, greater than 50% of which was counseling/coordinating care for F/U gastrostomy tube.

## 2017-12-26 DIAGNOSIS — J449 Chronic obstructive pulmonary disease, unspecified: Secondary | ICD-10-CM | POA: Diagnosis not present

## 2017-12-26 DIAGNOSIS — J9621 Acute and chronic respiratory failure with hypoxia: Secondary | ICD-10-CM | POA: Diagnosis not present

## 2017-12-26 DIAGNOSIS — I482 Chronic atrial fibrillation, unspecified: Secondary | ICD-10-CM | POA: Diagnosis not present

## 2017-12-26 DIAGNOSIS — J690 Pneumonitis due to inhalation of food and vomit: Secondary | ICD-10-CM | POA: Diagnosis not present

## 2017-12-26 NOTE — Progress Notes (Signed)
Pulmonary Critical Care Medicine [facility] [place]   PULMONARY CRITICAL CARE SERVICE  PROGRESS NOTE  Date of Service: 12/26/2017  Daniel Irwin  [id number]  DOB: 08/31/1955   DOA: 11/15/2017  Referring Physician: Carron Curie, MD  HPI: Daniel Irwin is a 62 y.o. male seen for follow up of Acute on Chronic Respiratory Failure.  Patient is on T collar comfortable at this time is been tolerating the PMV fairly well  Medications: Reviewed on Rounds  Physical Exam:  Vitals: Temperature 98.1 pulse 76 respiratory rate 24 blood pressure 110/57 saturations 94%  Ventilator Settings off the ventilator on T collar  . General: Comfortable at this time . Eyes: Grossly normal lids, irises & conjunctiva . ENT: grossly tongue is normal . Neck: no obvious mass . Cardiovascular: S1 S2 normal no gallop . Respiratory: No rhonchi or rales are noted . Abdomen: soft . Skin: no rash seen on limited exam . Musculoskeletal: not rigid . Psychiatric:unable to assess . Neurologic: no seizure no involuntary movements         Lab Data:   Basic Metabolic Panel: Recent Labs  Lab 12/22/17 0555  NA 142  K 4.1  CL 101  CO2 34*  GLUCOSE 144*  BUN 35*  CREATININE 0.64  CALCIUM 9.5    ABG: No results for input(s): PHART, PCO2ART, PO2ART, HCO3, O2SAT in the last 168 hours.  Liver Function Tests: No results for input(s): AST, ALT, ALKPHOS, BILITOT, PROT, ALBUMIN in the last 168 hours. No results for input(s): LIPASE, AMYLASE in the last 168 hours. No results for input(s): AMMONIA in the last 168 hours.  CBC: Recent Labs  Lab 12/22/17 0555  WBC 9.6  HGB 9.7*  HCT 31.5*  MCV 105.7*  PLT 269    Cardiac Enzymes: No results for input(s): CKTOTAL, CKMB, CKMBINDEX, TROPONINI in the last 168 hours.  BNP (last 3 results) No results for input(s): BNP in the last 8760 hours.  ProBNP (last 3 results) No results for input(s): PROBNP in the last 8760 hours.  Radiological  Exams: No results found.  Assessment/Plan Principal Problem:   Acute on chronic respiratory failure with hypoxia (HCC) Active Problems:   Severe sepsis with septic shock (CODE) (HCC)   Chronic atrial fibrillation   Metabolic encephalopathy   COPD, severe (HCC)   Aspiration pneumonia due to gastric secretions (HCC)   Delirium due to another medical condition   1. Acute on chronic respiratory failure with hypoxia patient is tolerating T collar fairly well has been doing fine with the PMV secretions are fair to moderate. 2. Severe sepsis with shock hemodynamically stable we will continue present therapy 3. Chronic atrial fibrillation rate is controlled we will follow along 4. Metabolic encephalopathy he is at baseline 5. Severe COPD advanced disease 6. Aspiration pneumonia treated we will continue to follow 7. Delirium stable at this time   I have personally seen and evaluated the patient, evaluated laboratory and imaging results, formulated the assessment and plan and placed orders. The Patient requires high complexity decision making for assessment and support.  Case was discussed on Rounds with the Respiratory Therapy Staff  Yevonne Pax, MD [facility] Pulmonary Critical Care Medicine Sleep Medicine

## 2017-12-27 DIAGNOSIS — J690 Pneumonitis due to inhalation of food and vomit: Secondary | ICD-10-CM | POA: Diagnosis not present

## 2017-12-27 DIAGNOSIS — I482 Chronic atrial fibrillation, unspecified: Secondary | ICD-10-CM | POA: Diagnosis not present

## 2017-12-27 DIAGNOSIS — J449 Chronic obstructive pulmonary disease, unspecified: Secondary | ICD-10-CM | POA: Diagnosis not present

## 2017-12-27 DIAGNOSIS — J9621 Acute and chronic respiratory failure with hypoxia: Secondary | ICD-10-CM | POA: Diagnosis not present

## 2017-12-27 NOTE — Progress Notes (Signed)
Pulmonary Critical Care Medicine [facility] [place]   PULMONARY CRITICAL CARE SERVICE  PROGRESS NOTE  Date of Service: 12/27/2017  Daniel Irwin  [id number]  DOB: Aug 08, 1955   DOA: 11/15/2017  Referring Physician: Carron Curie, MD  HPI: Daniel Irwin is a 62 y.o. male seen for follow up of Acute on Chronic Respiratory Failure.  Patient is on T collar is tolerating PMV well.  We should be able to switch over to cuffless trach today  Medications: Reviewed on Rounds  Physical Exam:  Vitals: Temperature 97.2 pulse 72 respiratory rate 20 blood pressure 107/61 saturations 97%  Ventilator Settings patient is off the ventilator at this time  . General: Comfortable at this time . Eyes: Grossly normal lids, irises & conjunctiva . ENT: grossly tongue is normal . Neck: no obvious mass . Cardiovascular: S1 S2 normal no gallop . Respiratory: No rhonchi no rales . Abdomen: soft . Skin: no rash seen on limited exam . Musculoskeletal: not rigid . Psychiatric:unable to assess . Neurologic: no seizure no involuntary movements         Lab Data:   Basic Metabolic Panel: Recent Labs  Lab 12/22/17 0555  NA 142  K 4.1  CL 101  CO2 34*  GLUCOSE 144*  BUN 35*  CREATININE 0.64  CALCIUM 9.5    ABG: No results for input(s): PHART, PCO2ART, PO2ART, HCO3, O2SAT in the last 168 hours.  Liver Function Tests: No results for input(s): AST, ALT, ALKPHOS, BILITOT, PROT, ALBUMIN in the last 168 hours. No results for input(s): LIPASE, AMYLASE in the last 168 hours. No results for input(s): AMMONIA in the last 168 hours.  CBC: Recent Labs  Lab 12/22/17 0555  WBC 9.6  HGB 9.7*  HCT 31.5*  MCV 105.7*  PLT 269    Cardiac Enzymes: No results for input(s): CKTOTAL, CKMB, CKMBINDEX, TROPONINI in the last 168 hours.  BNP (last 3 results) No results for input(s): BNP in the last 8760 hours.  ProBNP (last 3 results) No results for input(s): PROBNP in the last 8760  hours.  Radiological Exams: No results found.  Assessment/Plan Principal Problem:   Acute on chronic respiratory failure with hypoxia (HCC) Active Problems:   Severe sepsis with septic shock (CODE) (HCC)   Chronic atrial fibrillation   Metabolic encephalopathy   COPD, severe (HCC)   Aspiration pneumonia due to gastric secretions (HCC)   Delirium due to another medical condition   1. Acute on chronic respiratory failure with hypoxia we will continue weaning on T collar continue pulmonary toilet supportive care.  Right now sats are 97%.  Will proceed to change the trach to a cuffless trach and then hopefully be able to advance to capping 2. Chronic atrial fibrillation rate is controlled at this time we will continue to follow 3. Metabolic encephalopathy stable 4. Severe COPD we will continue present management 5. Pneumonia due to aspiration clinically improved   I have personally seen and evaluated the patient, evaluated laboratory and imaging results, formulated the assessment and plan and placed orders. The Patient requires high complexity decision making for assessment and support.  Case was discussed on Rounds with the Respiratory Therapy Staff  Yevonne Pax, MD [facility] Pulmonary Critical Care Medicine Sleep Medicine

## 2017-12-28 DIAGNOSIS — I482 Chronic atrial fibrillation, unspecified: Secondary | ICD-10-CM | POA: Diagnosis not present

## 2017-12-28 DIAGNOSIS — J9621 Acute and chronic respiratory failure with hypoxia: Secondary | ICD-10-CM | POA: Diagnosis not present

## 2017-12-28 DIAGNOSIS — J690 Pneumonitis due to inhalation of food and vomit: Secondary | ICD-10-CM | POA: Diagnosis not present

## 2017-12-28 DIAGNOSIS — J449 Chronic obstructive pulmonary disease, unspecified: Secondary | ICD-10-CM | POA: Diagnosis not present

## 2017-12-28 NOTE — Progress Notes (Signed)
Pulmonary Critical Care Medicine [facility] [place]   PULMONARY CRITICAL CARE SERVICE  PROGRESS NOTE  Date of Service: 12/28/2017  Daniel Irwin  [id number]  DOB: 24-May-1955   DOA: 11/15/2017  Referring Physician: Carron Curie, MD  HPI: Daniel Irwin is a 62 y.o. male seen for follow up of Acute on Chronic Respiratory Failure.  Patient is currently on T collar has been tolerating PMV fairly well also.  Secretions are fair to moderate.  Still requiring frequent suctioning  Medications: Reviewed on Rounds  Physical Exam:  Vitals: Temperature 97.9 pulse 94 respiratory 25 blood pressure 124/56 saturation 91%  Ventilator Settings currently is off the ventilator on T collar  . General: Comfortable at this time . Eyes: Grossly normal lids, irises & conjunctiva . ENT: grossly tongue is normal . Neck: no obvious mass . Cardiovascular: S1 S2 normal no gallop . Respiratory: No rhonchi or rales are noted at this time . Abdomen: soft . Skin: no rash seen on limited exam . Musculoskeletal: not rigid . Psychiatric:unable to assess . Neurologic: no seizure no involuntary movements         Lab Data:   Basic Metabolic Panel: Recent Labs  Lab 12/22/17 0555  NA 142  K 4.1  CL 101  CO2 34*  GLUCOSE 144*  BUN 35*  CREATININE 0.64  CALCIUM 9.5    ABG: No results for input(s): PHART, PCO2ART, PO2ART, HCO3, O2SAT in the last 168 hours.  Liver Function Tests: No results for input(s): AST, ALT, ALKPHOS, BILITOT, PROT, ALBUMIN in the last 168 hours. No results for input(s): LIPASE, AMYLASE in the last 168 hours. No results for input(s): AMMONIA in the last 168 hours.  CBC: Recent Labs  Lab 12/22/17 0555  WBC 9.6  HGB 9.7*  HCT 31.5*  MCV 105.7*  PLT 269    Cardiac Enzymes: No results for input(s): CKTOTAL, CKMB, CKMBINDEX, TROPONINI in the last 168 hours.  BNP (last 3 results) No results for input(s): BNP in the last 8760 hours.  ProBNP (last 3  results) No results for input(s): PROBNP in the last 8760 hours.  Radiological Exams: No results found.  Assessment/Plan Principal Problem:   Acute on chronic respiratory failure with hypoxia (HCC) Active Problems:   Severe sepsis with septic shock (CODE) (HCC)   Chronic atrial fibrillation   Metabolic encephalopathy   COPD, severe (HCC)   Aspiration pneumonia due to gastric secretions (HCC)   Delirium due to another medical condition   1. Acute on chronic respiratory failure with hypoxia we will continue with T collar we will continue pulmonary toilet supportive care patient is tolerating PMV.  Because of the increased secretions not able to decannulate 2. Chronic atrial fibrillation rate is controlled we will continue to monitor 3. Metabolic encephalopathy he is at his baseline 4. Severe COPD stable at this time still with copious secretions as above 5. Aspiration pneumonia treated we will continue to follow   I have personally seen and evaluated the patient, evaluated laboratory and imaging results, formulated the assessment and plan and placed orders. The Patient requires high complexity decision making for assessment and support.  Case was discussed on Rounds with the Respiratory Therapy Staff  Yevonne Pax, MD [facility] Pulmonary Critical Care Medicine Sleep Medicine

## 2017-12-29 DIAGNOSIS — I482 Chronic atrial fibrillation, unspecified: Secondary | ICD-10-CM | POA: Diagnosis not present

## 2017-12-29 DIAGNOSIS — J449 Chronic obstructive pulmonary disease, unspecified: Secondary | ICD-10-CM | POA: Diagnosis not present

## 2017-12-29 DIAGNOSIS — J690 Pneumonitis due to inhalation of food and vomit: Secondary | ICD-10-CM | POA: Diagnosis not present

## 2017-12-29 DIAGNOSIS — J9621 Acute and chronic respiratory failure with hypoxia: Secondary | ICD-10-CM | POA: Diagnosis not present

## 2017-12-29 NOTE — Progress Notes (Signed)
Pulmonary Critical Care Medicine [facility] [place]   PULMONARY CRITICAL CARE SERVICE  PROGRESS NOTE  Date of Service: 12/29/2017  Daniel Irwin  [id number]  DOB: 03-Apr-1955   DOA: 11/15/2017  Referring Physician: Carron Curie, MD  HPI: Daniel Irwin is a 62 y.o. male seen for follow up of Acute on Chronic Respiratory Failure.  Remains on T collar at this time on 28% oxygen good saturations noted  Medications: Reviewed on Rounds  Physical Exam:  Vitals: Temperature 97.6 pulse 73 respiratory 24 blood pressure 134/81 saturation 97%  Ventilator Settings off the ventilator on T collar  . General: Comfortable at this time . Eyes: Grossly normal lids, irises & conjunctiva . ENT: grossly tongue is normal . Neck: no obvious mass . Cardiovascular: S1 S2 normal no gallop . Respiratory: No rhonchi no rales . Abdomen: soft . Skin: no rash seen on limited exam . Musculoskeletal: not rigid . Psychiatric:unable to assess . Neurologic: no seizure no involuntary movements         Lab Data:   Basic Metabolic Panel: No results for input(s): NA, K, CL, CO2, GLUCOSE, BUN, CREATININE, CALCIUM, MG, PHOS in the last 168 hours.  ABG: No results for input(s): PHART, PCO2ART, PO2ART, HCO3, O2SAT in the last 168 hours.  Liver Function Tests: No results for input(s): AST, ALT, ALKPHOS, BILITOT, PROT, ALBUMIN in the last 168 hours. No results for input(s): LIPASE, AMYLASE in the last 168 hours. No results for input(s): AMMONIA in the last 168 hours.  CBC: No results for input(s): WBC, NEUTROABS, HGB, HCT, MCV, PLT in the last 168 hours.  Cardiac Enzymes: No results for input(s): CKTOTAL, CKMB, CKMBINDEX, TROPONINI in the last 168 hours.  BNP (last 3 results) No results for input(s): BNP in the last 8760 hours.  ProBNP (last 3 results) No results for input(s): PROBNP in the last 8760 hours.  Radiological Exams: No results found.  Assessment/Plan Principal Problem:    Acute on chronic respiratory failure with hypoxia (HCC) Active Problems:   Severe sepsis with septic shock (CODE) (HCC)   Chronic atrial fibrillation   Metabolic encephalopathy   COPD, severe (HCC)   Aspiration pneumonia due to gastric secretions (HCC)   Delirium due to another medical condition   1. Acute on chronic respiratory failure with hypoxia continue weaning on T collar continue pulmonary toilet supportive care. 2. Severe sepsis shock hemodynamically stable 3. Chronic atrial fibrillation rate is controlled at this time 4. Metabolic encephalopathy at baseline 5. Severe COPD treated we will continue to monitor 6. Aspiration pneumonia treated continue with supportive care   I have personally seen and evaluated the patient, evaluated laboratory and imaging results, formulated the assessment and plan and placed orders. The Patient requires high complexity decision making for assessment and support.  Case was discussed on Rounds with the Respiratory Therapy Staff  Yevonne Pax, MD [facility] Pulmonary Critical Care Medicine Sleep Medicine

## 2018-05-15 DEATH — deceased

## 2020-08-09 IMAGING — DX DG ABD PORTABLE 1V
1 series · 2 of 2 positions shown · non-contrast
Comparison: None.

CLINICAL DATA: NG tube placement

EXAM:
PORTABLE ABDOMEN - 1 VIEW

[Series 1: abdomen · 0.14mm/px · 2 of 2 slices shown]
[im 1/2]
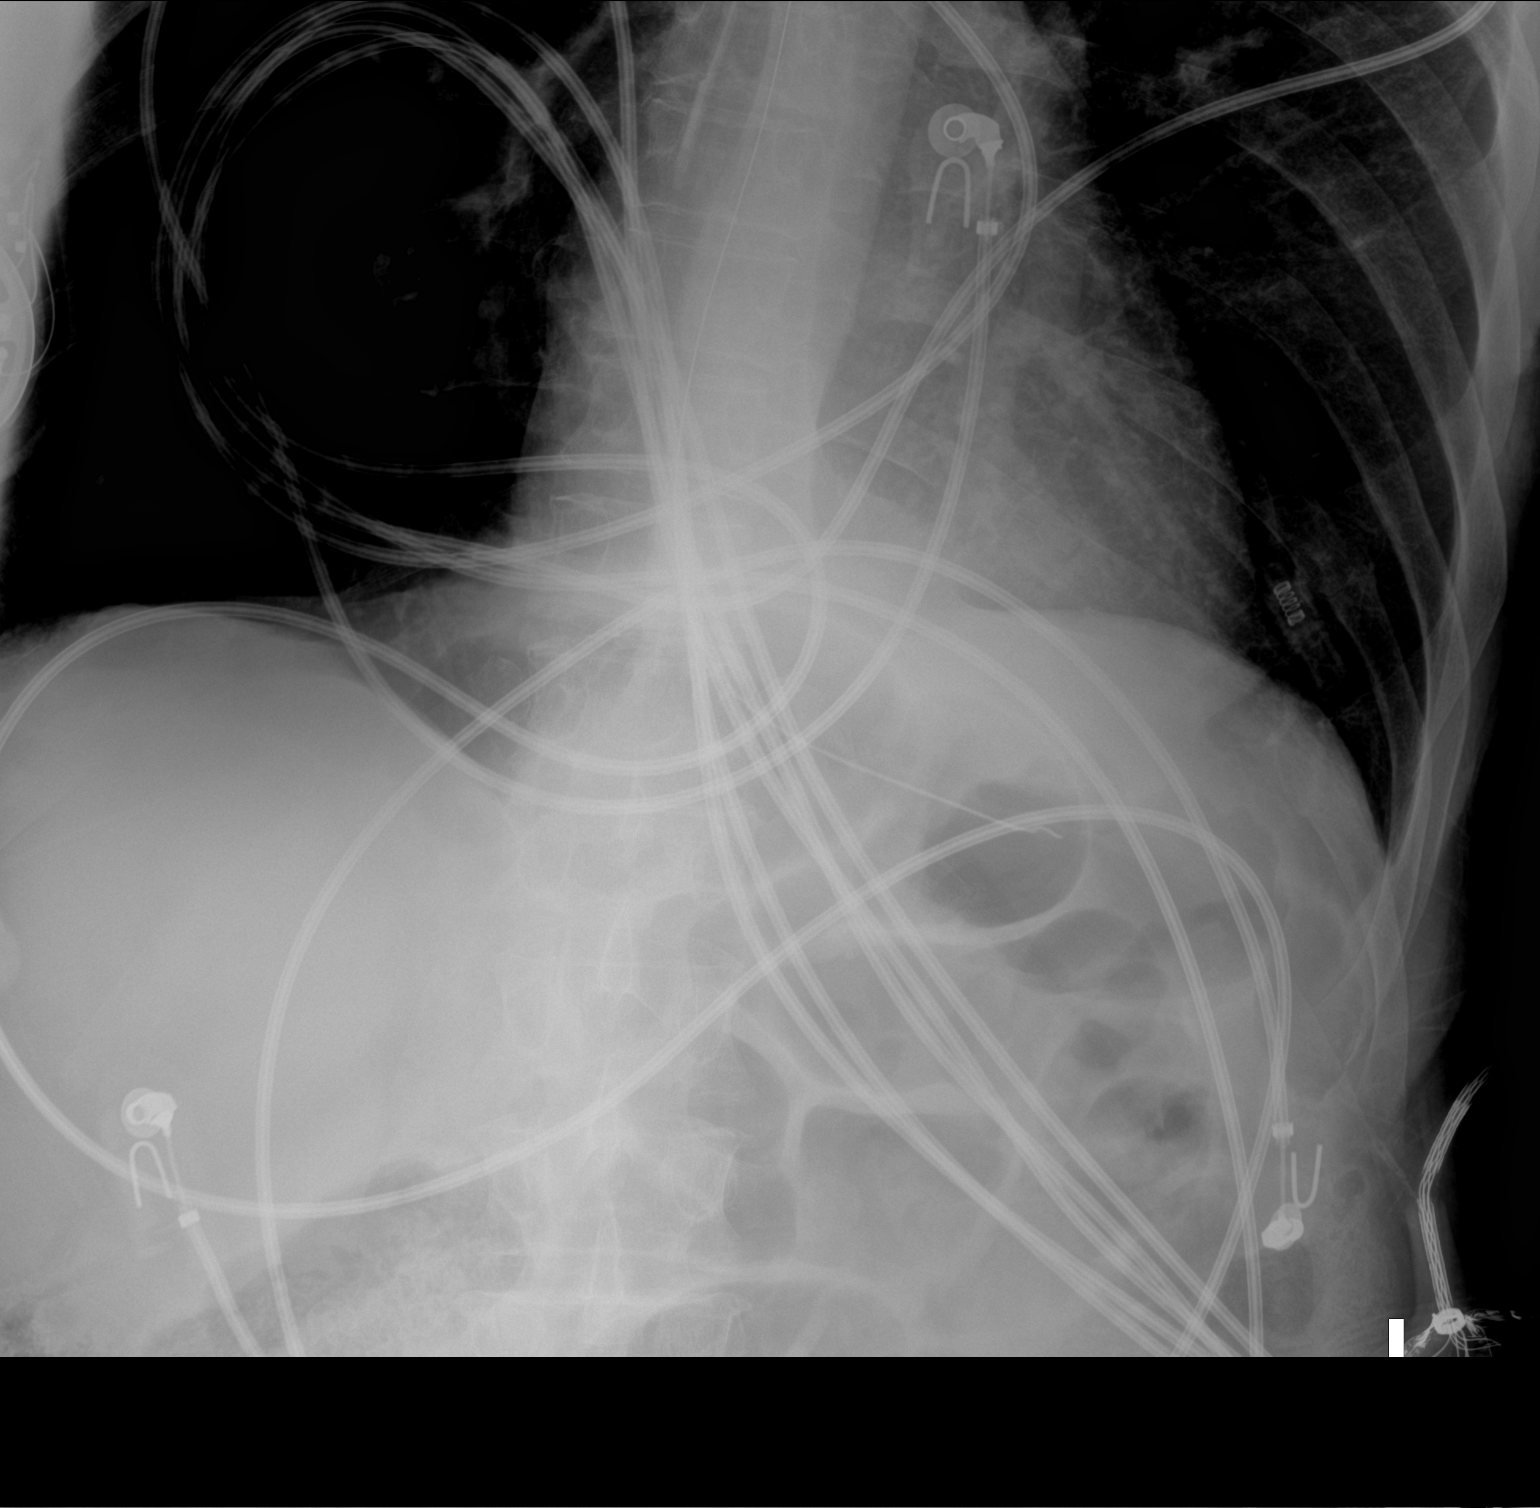
[im 2/2]
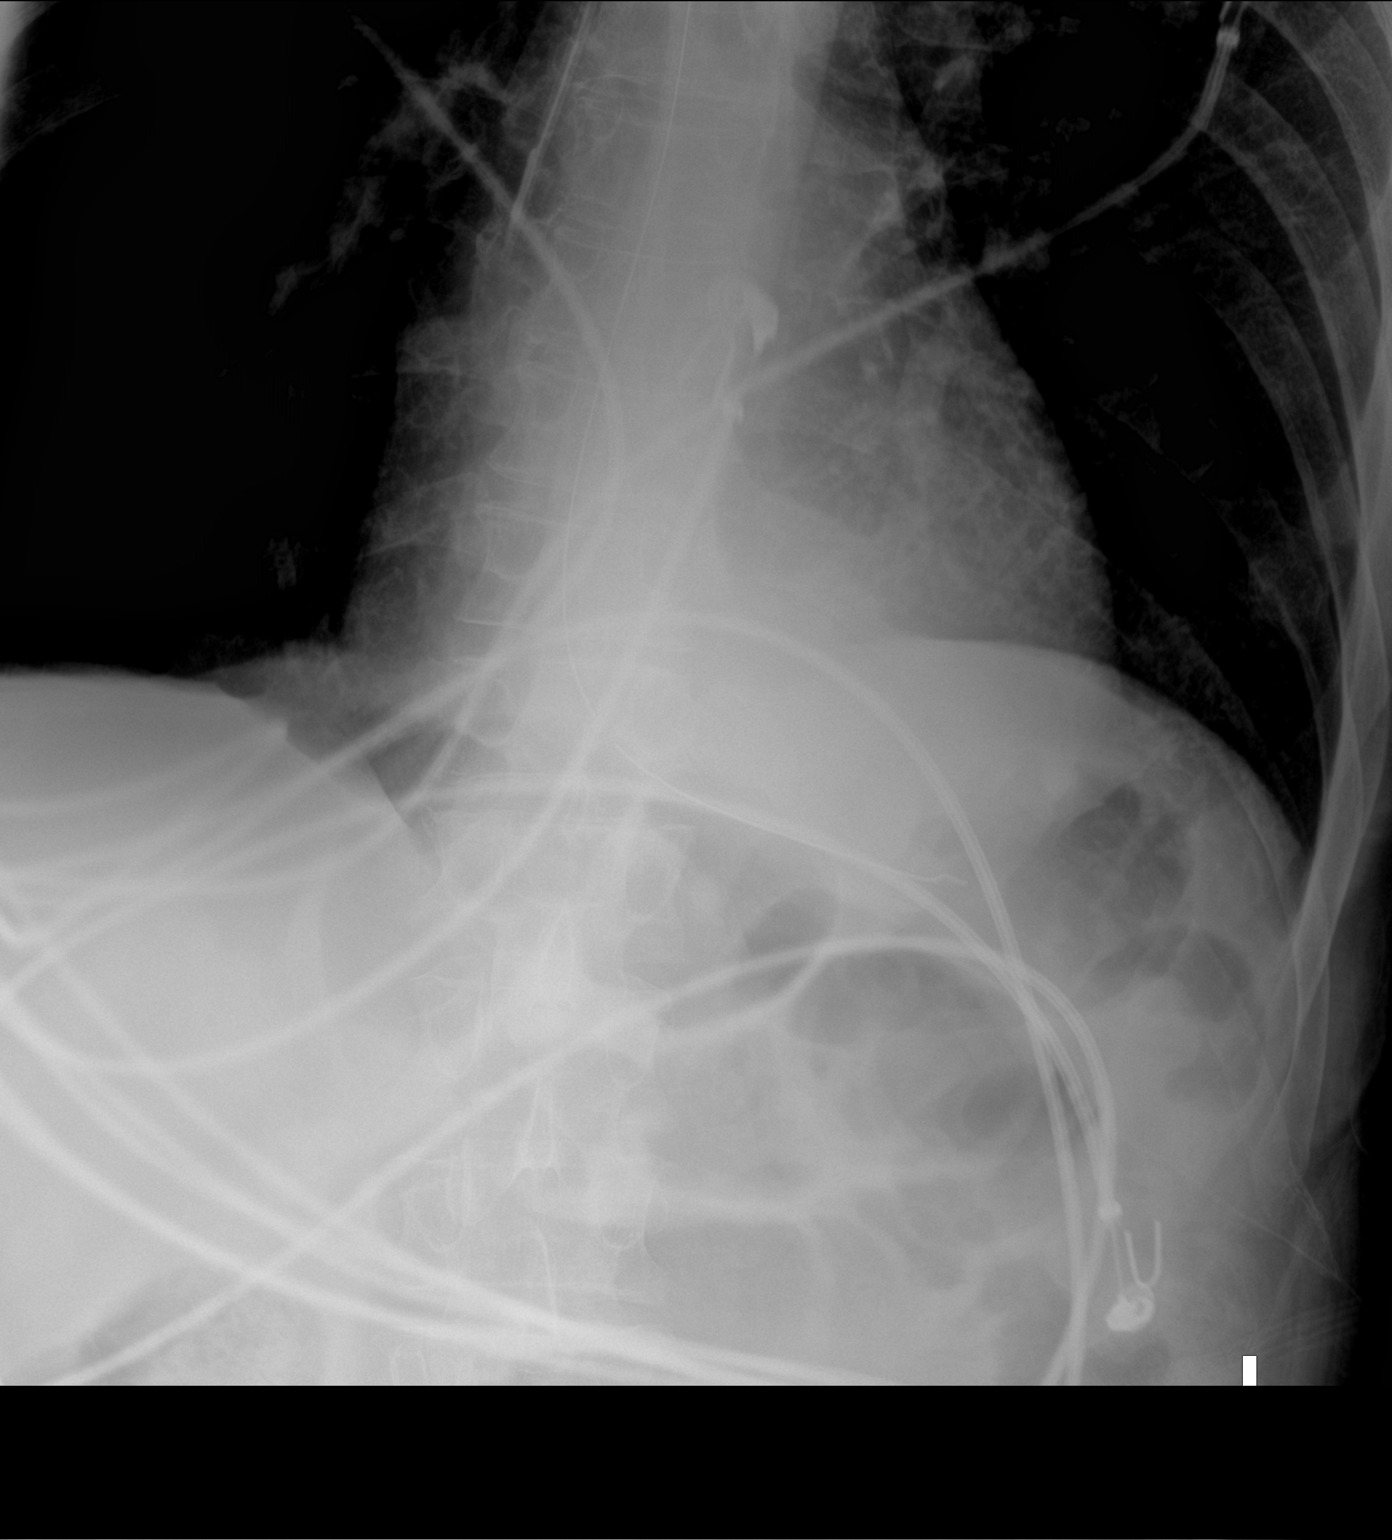

[2 of 2 positions shown; findings below may reference images not displayed]

FINDINGS: Monitor leads overlie the chest and abdomen. NG tube extends to the
proximal stomach beneath the left hemidiaphragm. Central line tip at
the SVC RA junction, partially imaged. Nonspecific bowel gas
pattern.
IMPRESSION: NG tube proximal stomach.

## 2020-08-10 IMAGING — DX DG ABD PORTABLE 1V
1 series · 1 of 1 positions shown · non-contrast
Comparison: Abdominal radiograph 11/16/2017.

CLINICAL DATA: 62-year-old male with history of nasogastric tube
placement.

EXAM:
PORTABLE ABDOMEN - 1 VIEW

[abdomen]
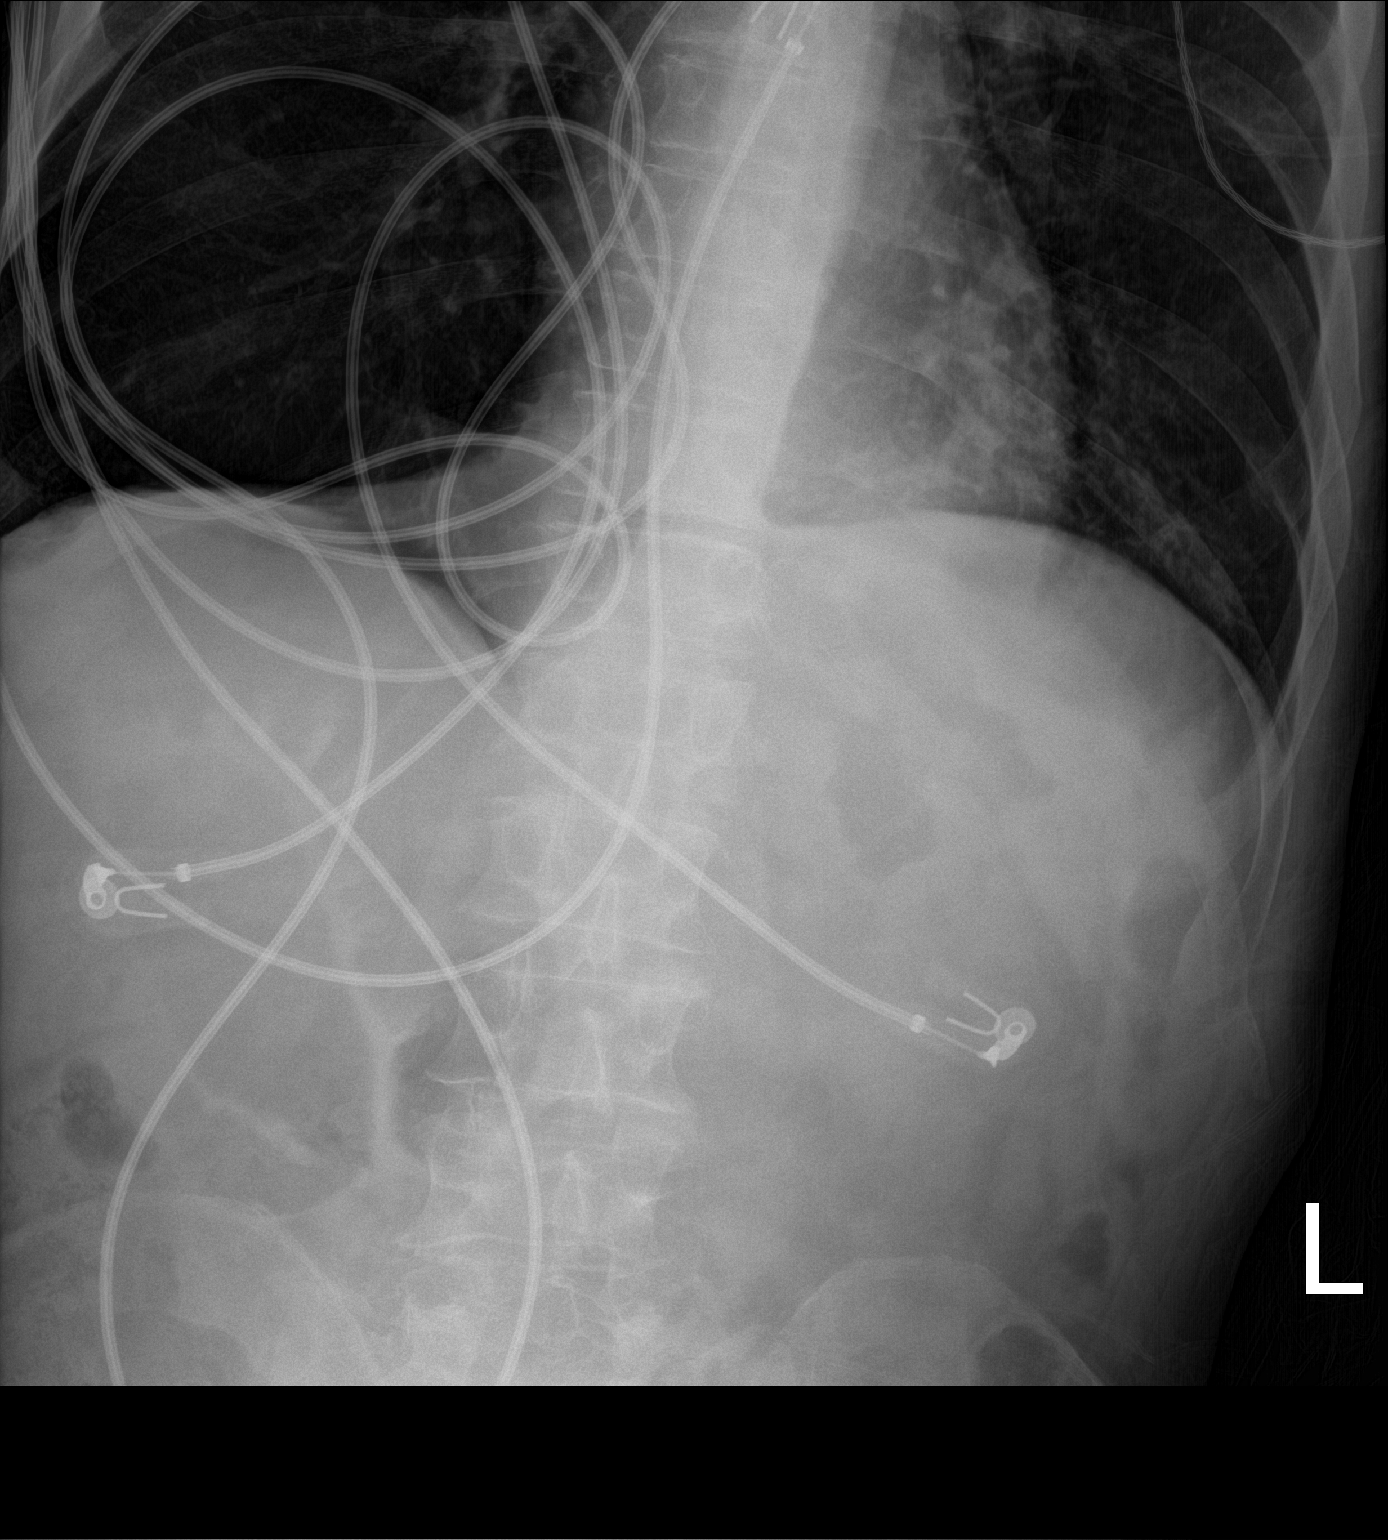

[1 of 1 positions shown; findings below may reference images not displayed]

FINDINGS: Previously noted nasogastric tube is no longer confidently
identified. Abdomen is incompletely imaged, but there is a
nonobstructive bowel gas pattern.
IMPRESSION: 1. Nasogastric tube not visualized, presumably withdrawn or
potentially coiled in the oropharynx.

## 2020-08-10 IMAGING — DX DG ABD PORTABLE 1V
1 series · 1 of 1 positions shown · non-contrast
Comparison: 11/16/2017

CLINICAL DATA: NG tube placement

EXAM:
PORTABLE ABDOMEN - 1 VIEW

[abdomen kub]
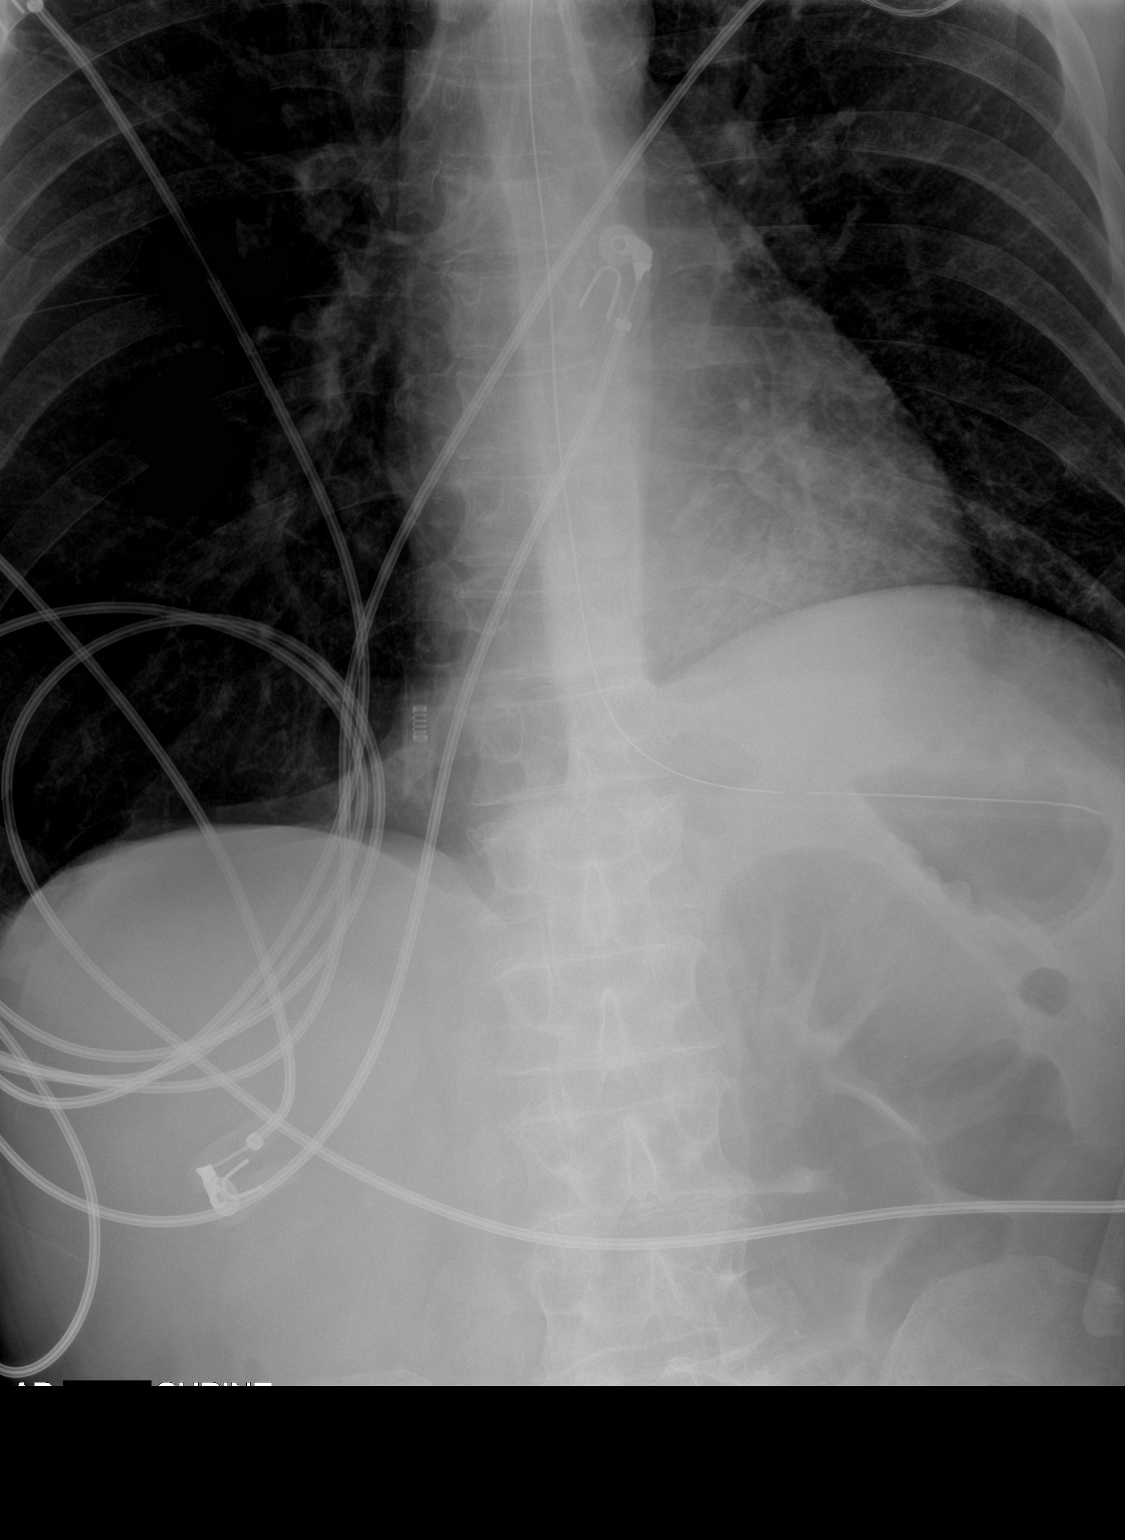

[1 of 1 positions shown; findings below may reference images not displayed]

FINDINGS: Esophageal tube tip overlies the gastric fundus region. Elevation of
the left diaphragm. Mild gaseous enlargement of left upper quadrant
bowel.
IMPRESSION: Esophageal tube tip overlies the proximal stomach

## 2020-08-10 IMAGING — DX DG ABD PORTABLE 1V
1 series · 1 of 1 positions shown · non-contrast
Comparison: 11/15/2017.

CLINICAL DATA: 62-year-old male. Nasogastric tube placement.
Subsequent encounter.

EXAM:
PORTABLE ABDOMEN - 1 VIEW

[abdomen kub]
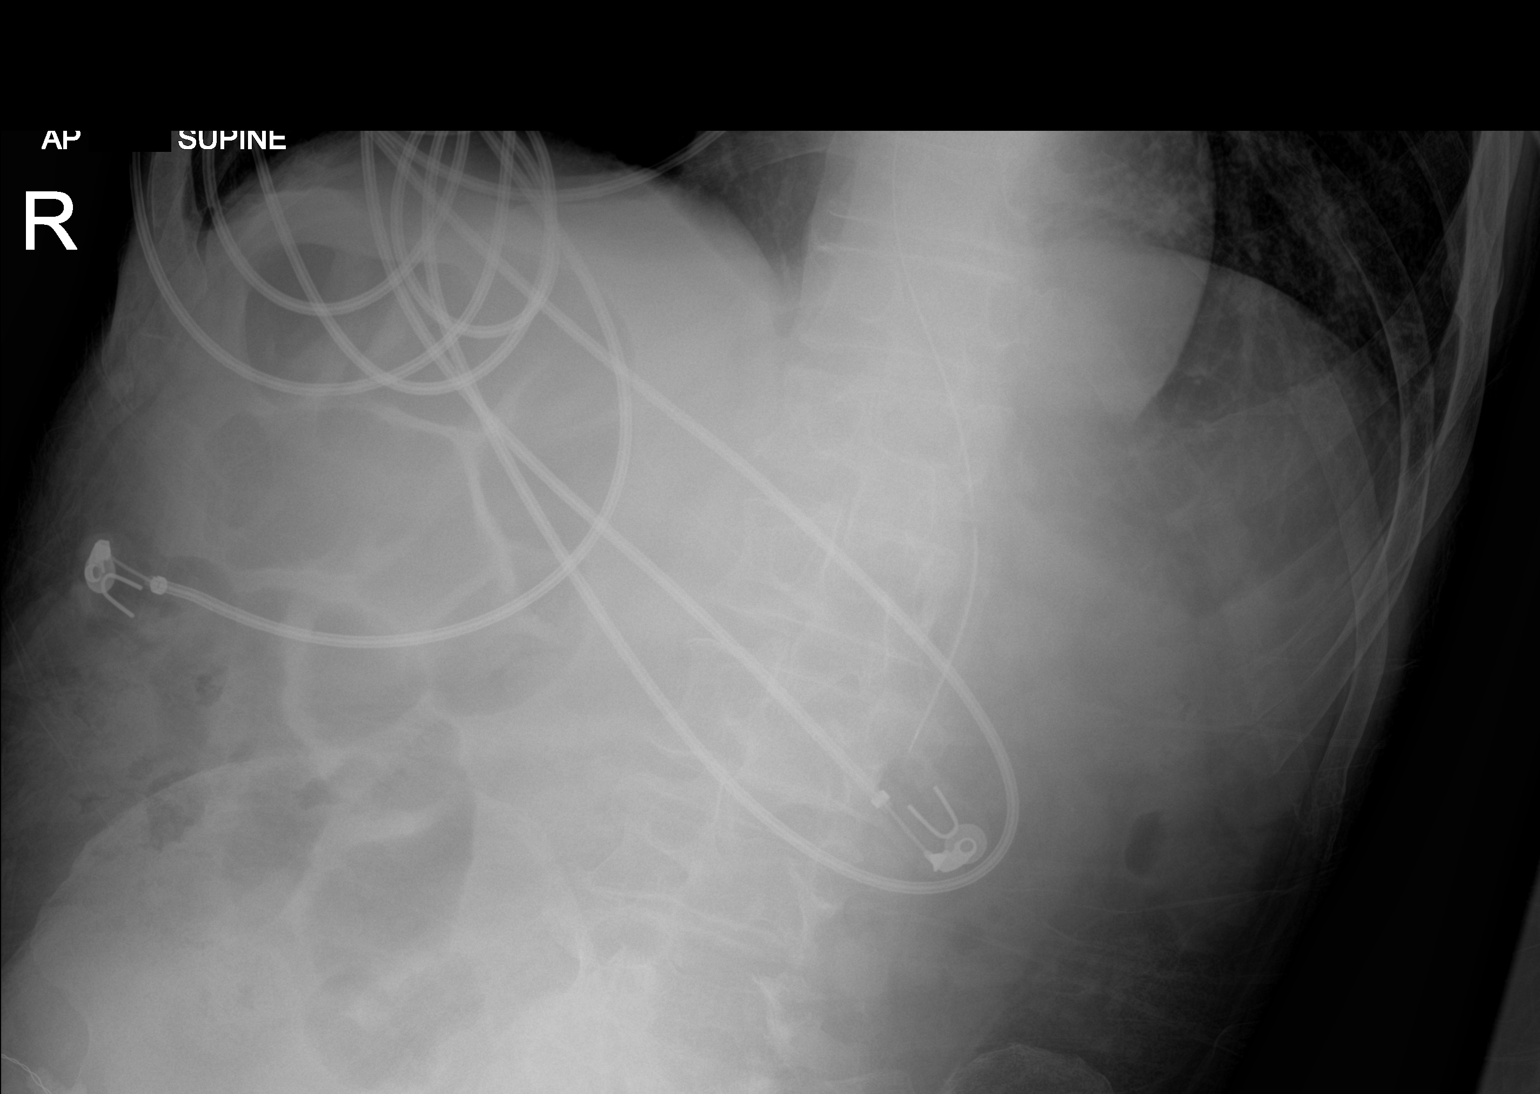

[1 of 1 positions shown; findings below may reference images not displayed]

FINDINGS: Nasogastric tube side hole just beyond the gastroesophageal junction
level. Tip gastric fundus-body junction level. Gas-filled bowel
right aspect of the abdomen.
IMPRESSION: Nasogastric tube tip gastric fundus-body junction level. Nasogastric
tube side hole just beyond the gastroesophageal junction level.
